# Patient Record
Sex: Male | Born: 1945
Health system: Southern US, Community
[De-identification: ages and names within clinical notes are randomized; demographics above are authoritative.]

## PROBLEM LIST (undated history)

## (undated) DIAGNOSIS — E785 Hyperlipidemia, unspecified: Secondary | ICD-10-CM

## (undated) DIAGNOSIS — K219 Gastro-esophageal reflux disease without esophagitis: Secondary | ICD-10-CM

## (undated) DIAGNOSIS — Z87442 Personal history of urinary calculi: Secondary | ICD-10-CM

## (undated) DIAGNOSIS — R93429 Abnormal radiologic findings on diagnostic imaging of unspecified kidney: Secondary | ICD-10-CM

## (undated) HISTORY — PX: OTHER SURGICAL HISTORY: SHX169

## (undated) HISTORY — PX: HEMORRHOID SURGERY: SHX153

---

## 1999-02-16 ENCOUNTER — Encounter: Payer: Self-pay | Admitting: Emergency Medicine

## 1999-02-16 ENCOUNTER — Emergency Department (HOSPITAL_COMMUNITY): Admission: EM | Admit: 1999-02-16 | Discharge: 1999-02-16 | Payer: Self-pay | Admitting: Emergency Medicine

## 2000-10-09 ENCOUNTER — Ambulatory Visit (HOSPITAL_COMMUNITY): Admission: RE | Admit: 2000-10-09 | Discharge: 2000-10-09 | Payer: Self-pay | Admitting: *Deleted

## 2003-04-12 ENCOUNTER — Encounter (HOSPITAL_BASED_OUTPATIENT_CLINIC_OR_DEPARTMENT_OTHER): Payer: Self-pay | Admitting: General Surgery

## 2003-04-14 ENCOUNTER — Ambulatory Visit (HOSPITAL_COMMUNITY): Admission: RE | Admit: 2003-04-14 | Discharge: 2003-04-14 | Payer: Self-pay | Admitting: General Surgery

## 2003-04-14 HISTORY — PX: HEMORRHOID SURGERY: SHX153

## 2003-10-31 ENCOUNTER — Ambulatory Visit (HOSPITAL_COMMUNITY): Admission: RE | Admit: 2003-10-31 | Discharge: 2003-10-31 | Payer: Self-pay | Admitting: *Deleted

## 2003-12-12 ENCOUNTER — Emergency Department (HOSPITAL_COMMUNITY): Admission: EM | Admit: 2003-12-12 | Discharge: 2003-12-12 | Payer: Self-pay | Admitting: Emergency Medicine

## 2003-12-26 ENCOUNTER — Observation Stay (HOSPITAL_COMMUNITY): Admission: RE | Admit: 2003-12-26 | Discharge: 2003-12-27 | Payer: Self-pay | Admitting: *Deleted

## 2003-12-26 HISTORY — PX: LAPAROSCOPIC CHOLECYSTECTOMY: SUR755

## 2004-02-07 ENCOUNTER — Emergency Department (HOSPITAL_COMMUNITY): Admission: EM | Admit: 2004-02-07 | Discharge: 2004-02-07 | Payer: Self-pay | Admitting: Emergency Medicine

## 2005-11-10 DIAGNOSIS — Z9889 Other specified postprocedural states: Secondary | ICD-10-CM | POA: Insufficient documentation

## 2005-12-12 ENCOUNTER — Ambulatory Visit (HOSPITAL_COMMUNITY): Admission: RE | Admit: 2005-12-12 | Discharge: 2005-12-12 | Payer: Self-pay | Admitting: *Deleted

## 2010-11-30 ENCOUNTER — Encounter: Payer: Self-pay | Admitting: Internal Medicine

## 2012-05-20 ENCOUNTER — Encounter (INDEPENDENT_AMBULATORY_CARE_PROVIDER_SITE_OTHER): Payer: BC Managed Care – PPO | Admitting: Ophthalmology

## 2012-05-20 DIAGNOSIS — D3130 Benign neoplasm of unspecified choroid: Secondary | ICD-10-CM

## 2012-05-20 DIAGNOSIS — H2510 Age-related nuclear cataract, unspecified eye: Secondary | ICD-10-CM

## 2012-05-20 DIAGNOSIS — H43819 Vitreous degeneration, unspecified eye: Secondary | ICD-10-CM

## 2012-10-05 ENCOUNTER — Emergency Department (HOSPITAL_COMMUNITY): Payer: Medicare Other

## 2012-10-05 ENCOUNTER — Emergency Department (HOSPITAL_COMMUNITY)
Admission: EM | Admit: 2012-10-05 | Discharge: 2012-10-05 | Disposition: A | Discharge status: Home / Self Care | Payer: Medicare Other | Attending: Emergency Medicine | Admitting: Emergency Medicine

## 2012-10-05 ENCOUNTER — Encounter (HOSPITAL_COMMUNITY): Payer: Self-pay | Admitting: Emergency Medicine

## 2012-10-05 DIAGNOSIS — N200 Calculus of kidney: Secondary | ICD-10-CM | POA: Insufficient documentation

## 2012-10-05 DIAGNOSIS — N201 Calculus of ureter: Secondary | ICD-10-CM | POA: Diagnosis not present

## 2012-10-05 DIAGNOSIS — K5730 Diverticulosis of large intestine without perforation or abscess without bleeding: Secondary | ICD-10-CM | POA: Diagnosis not present

## 2012-10-05 DIAGNOSIS — Z79899 Other long term (current) drug therapy: Secondary | ICD-10-CM | POA: Insufficient documentation

## 2012-10-05 DIAGNOSIS — Z7982 Long term (current) use of aspirin: Secondary | ICD-10-CM | POA: Insufficient documentation

## 2012-10-05 LAB — COMPREHENSIVE METABOLIC PANEL
ALT: 22 U/L (ref 0–53)
Albumin: 3.8 g/dL (ref 3.5–5.2)
Alkaline Phosphatase: 78 U/L (ref 39–117)
Calcium: 9.6 mg/dL (ref 8.4–10.5)
GFR calc Af Amer: 82 mL/min — ABNORMAL LOW (ref >90)
Potassium: 3.8 mEq/L (ref 3.5–5.1)
Sodium: 140 mEq/L (ref 135–145)
Total Protein: 6.7 g/dL (ref 6.0–8.3)

## 2012-10-05 LAB — CBC WITH DIFFERENTIAL/PLATELET
Basophils Absolute: 0 10*3/uL (ref 0.0–0.1)
Basophils Relative: 0 % (ref 0–1)
Eosinophils Absolute: 0.1 10*3/uL (ref 0.0–0.7)
Eosinophils Relative: 0 % (ref 0–5)
MCH: 30.8 pg (ref 26.0–34.0)
MCHC: 34.9 g/dL (ref 30.0–36.0)
MCV: 88.3 fL (ref 78.0–100.0)
Neutrophils Relative %: 78 % — ABNORMAL HIGH (ref 43–77)
Platelets: 255 10*3/uL (ref 150–400)
RBC: 4.87 MIL/uL (ref 4.22–5.81)
RDW: 12.2 % (ref 11.5–15.5)

## 2012-10-05 LAB — URINALYSIS, MICROSCOPIC ONLY
Bilirubin Urine: NEGATIVE
Nitrite: NEGATIVE
Specific Gravity, Urine: 1.023 (ref 1.005–1.030)
Urobilinogen, UA: 1 mg/dL (ref 0.0–1.0)
pH: 7.5 (ref 5.0–8.0)

## 2012-10-05 MED ORDER — FENTANYL CITRATE 0.05 MG/ML IJ SOLN
50.0000 ug | Freq: Once | INTRAMUSCULAR | Status: AC
  Administered 2012-10-05: 50 ug via INTRAVENOUS
  Filled 2012-10-05: qty 2

## 2012-10-05 MED ORDER — SODIUM CHLORIDE 0.9 % IV SOLN
Freq: Once | INTRAVENOUS | Status: AC
  Administered 2012-10-05: 20 mL/h via INTRAVENOUS

## 2012-10-05 MED ORDER — KETOROLAC TROMETHAMINE 30 MG/ML IJ SOLN
30.0000 mg | Freq: Once | INTRAMUSCULAR | Status: AC
  Administered 2012-10-05: 30 mg via INTRAVENOUS
  Filled 2012-10-05: qty 1

## 2012-10-05 MED ORDER — ONDANSETRON HCL 4 MG/2ML IJ SOLN
4.0000 mg | Freq: Once | INTRAMUSCULAR | Status: AC
  Administered 2012-10-05: 4 mg via INTRAVENOUS
  Filled 2012-10-05: qty 2

## 2012-10-05 MED ORDER — ONDANSETRON HCL 8 MG PO TABS: 8.0000 mg | ORAL_TABLET | Freq: Three times a day (TID) | ORAL | Status: DC | PRN

## 2012-10-05 MED ORDER — TAMSULOSIN HCL 0.4 MG PO CAPS: 0.4000 mg | ORAL_CAPSULE | Freq: Every day | ORAL | Status: DC

## 2012-10-05 MED ORDER — OXYCODONE-ACETAMINOPHEN 5-325 MG PO TABS: 2.0000 | ORAL_TABLET | ORAL | Status: DC | PRN

## 2012-10-05 MED ORDER — HYDROMORPHONE HCL PF 1 MG/ML IJ SOLN
1.0000 mg | Freq: Once | INTRAMUSCULAR | Status: AC
  Administered 2012-10-05: 1 mg via INTRAVENOUS
  Filled 2012-10-05: qty 1

## 2012-10-05 MED ORDER — MORPHINE SULFATE 4 MG/ML IJ SOLN
4.0000 mg | Freq: Once | INTRAMUSCULAR | Status: AC
  Administered 2012-10-05: 4 mg via INTRAVENOUS
  Filled 2012-10-05: qty 1

## 2012-10-05 NOTE — ED Notes (Signed)
Pt return from CT.

## 2012-10-05 NOTE — ED Provider Notes (Signed)
Steve Harris is a 66 y.o. male with onset of right flank pain today, it radiates to right abdomen. No similar, in the past. No dysuria, or urinary frequency. No visible hematuria.  Exam alert, cooperative. Abdomen soft, and nontender.   Evaluation consistent with proximal right ureter stone, as the source of pain. He stable for discharge   Medical screening examination/treatment/procedure(s) were conducted as a shared visit with non-physician practitioner(s) and myself.  I personally evaluated the patient during the encounter  Flint Melter, MD 10/08/12 [phone]

## 2012-10-05 NOTE — ED Notes (Signed)
Pt to CT

## 2012-10-05 NOTE — Discharge Instructions (Signed)
Take percocet as needed for severe pain.  Do not drive within four hours of taking this medication.  Take zofran as needed for nausea.  Take Flomax as prescribed.  This medication may cause lightheadedness upon standing.  Drink plenty of fluids.  Follow up with [facility].  You should return to the ER if you develop fever, worsening pain or uncontrolled vomiting.   Kidney Stones Kidney stones (ureteral lithiasis) are deposits that form inside your kidneys. The intense pain is caused by the stone moving through the urinary tract. When the stone moves, the ureter goes into spasm around the stone. The stone is usually passed in the urine.  CAUSES   A disorder that makes certain neck glands produce too much parathyroid hormone (primary hyperparathyroidism).  A buildup of uric acid crystals.  Narrowing (stricture) of the ureter.  A kidney obstruction present at birth (congenital obstruction).  Previous surgery on the kidney or ureters.  Numerous kidney infections. SYMPTOMS   Feeling sick to your stomach (nauseous).  Throwing up (vomiting).  Blood in the urine (hematuria).  Pain that usually spreads (radiates) to the groin.  Frequency or urgency of urination. DIAGNOSIS   Taking a history and physical exam.  Blood or urine tests.  Computerized X-ray scan (CT scan).  Occasionally, an examination of the inside of the urinary bladder (cystoscopy) is performed. TREATMENT   Observation.  Increasing your fluid intake.  Surgery may be needed if you have severe pain or persistent obstruction. The size, location, and chemical composition are all important variables that will determine the proper choice of action for you. Talk to your caregiver to better understand your situation so that you will minimize the risk of injury to yourself and your kidney.  HOME CARE INSTRUCTIONS   Drink enough water and fluids to keep your urine clear or pale yellow.  Strain all urine through the  provided strainer. Keep all particulate matter and stones for your caregiver to see. The stone causing the pain may be as small as a grain of salt. It is very important to use the strainer each and every time you pass your urine. The collection of your stone will allow your caregiver to analyze it and verify that a stone has actually passed.  Only take over-the-counter or prescription medicines for pain, discomfort, or fever as directed by your caregiver.  Make a follow-up appointment with your caregiver as directed.  Get follow-up X-rays if required. The absence of pain does not always mean that the stone has passed. It may have only stopped moving. If the urine remains completely obstructed, it can cause loss of kidney function or even complete destruction of the kidney. It is your responsibility to make sure X-rays and follow-ups are completed. Ultrasounds of the kidney can show blockages and the status of the kidney. Ultrasounds are not associated with any radiation and can be performed easily in a matter of minutes. SEEK IMMEDIATE MEDICAL CARE IF:   Pain cannot be controlled with the prescribed medicine.  You have a fever.  The severity or intensity of pain increases over 18 hours and is not relieved by pain medicine.  You develop a new onset of abdominal pain.  You feel faint or pass out. MAKE SURE YOU:   Understand these instructions.  Will watch your condition.  Will get help right away if you are not doing well or get worse. Document Released: 10/27/2005 Document Revised: 01/19/2012 Document Reviewed: 02/22/2010 [facility] Patient Information 2013 [place], [place].

## 2012-10-05 NOTE — ED Notes (Signed)
Per EMS, pt c/o abdominal pain since 1700 and nauseated.  Pt denies vomiting, 4 Zofran given en route.  Pt states more comfortable when lying on his side.

## 2012-10-05 NOTE — ED Notes (Signed)
Pt states that RLQ pain began at 1645.  Pt states "It felt like I had gas because I ate more fatty foods today than I should have.  I forced myself to have a BM, but it didn't help.  I had pain like this before with gall stones, but the removed my gallbladder about 10 years ago."

## 2012-10-05 NOTE — ED Provider Notes (Signed)
History     CSN: [id number]  Arrival date & time 10/05/12  1918   First MD Initiated Contact with Patient 10/05/12 1959      Chief Complaint  Patient presents with  . Abdominal Pain    (Consider location/radiation/quality/duration/timing/severity/associated sxs/prior treatment) HPI History provided by pt.   Pt reports acute onset severe RLQ pain at 4:45 today.  Non-radiating.  No aggravating/alleviating factors.  Associated w/ nausea.  Has had constipation, described as having to strain to have a BM, for the last 2 days.  No urinary sx nor testicular pain.  Past abd surgeries include cholecystectomy and inguinal hernia repair.  No h/o kidney stones.   History reviewed. No pertinent past medical history.  Past Surgical History  Procedure Date  . Cholecystectomy   . Hemorrhoid surgery   . Hernia repair     History reviewed. No pertinent family history.  History  Substance Use Topics  . Smoking status: Never Smoker   . Smokeless tobacco: Never Used  . Alcohol Use: No      Review of Systems  All other systems reviewed and are negative.    Allergies  Review of patient's allergies indicates no known allergies.  Home Medications   Current Outpatient Rx  Name  Route  Sig  Dispense  Refill  . ASPIRIN 81 MG PO TABS   Oral   Take 81 mg by mouth daily.         . ATORVASTATIN CALCIUM 20 MG PO TABS   Oral   Take 10 mg by mouth every evening.          Marland Kitchen ESOMEPRAZOLE MAGNESIUM 20 MG PO CPDR   Oral   Take 20 mg by mouth daily before breakfast.           BP 122/68  Pulse 54  Temp 98.8 F (37.1 C) (Oral)  Resp 20  Ht 5' 11.5" (1.816 m)  Wt 210 lb (95.255 kg)  BMI 28.88 kg/m2  SpO2 100%  Physical Exam  Nursing note and vitals reviewed. Constitutional: He is oriented to person, place, and time. He appears well-developed and well-nourished. No distress.       Pt writhing in pain and begging for pain medication  HENT:  Head: Normocephalic and atraumatic.    Eyes:       Normal appearance  Neck: Normal range of motion.  Cardiovascular: Normal rate and regular rhythm.   Pulmonary/Chest: Effort normal and breath sounds normal. No respiratory distress.  Abdominal: Soft. Bowel sounds are normal. He exhibits no distension and no mass. There is no tenderness. There is no rebound and no guarding.       obese  Genitourinary:       No CVA tenderness.  No testicular tenderness.   Musculoskeletal: Normal range of motion.  Neurological: He is alert and oriented to person, place, and time.  Skin: Skin is warm and dry. No rash noted.  Psychiatric: He has a normal mood and affect. His behavior is normal.    ED Course  Procedures (including critical care time)  Labs Reviewed  CBC WITH DIFFERENTIAL - Abnormal; Notable for the following:    WBC 12.1 (*)     Neutrophils Relative 78 (*)     Neutro Abs 9.4 (*)     All other components within normal limits  COMPREHENSIVE METABOLIC PANEL - Abnormal; Notable for the following:    Glucose, Bld 115 (*)     GFR calc non Af Amer 70 (*)  GFR calc Af Amer 82 (*)     All other components within normal limits  URINALYSIS, MICROSCOPIC ONLY - Abnormal; Notable for the following:    APPearance TURBID (*)     Hgb urine dipstick LARGE (*)     Ketones, ur 15 (*)     All other components within normal limits  LIPASE, BLOOD   Ct Abdomen Pelvis Wo Contrast  10/05/2012  *RADIOLOGY REPORT*  Clinical Data: Abdominal pain, right flank pain.  CT ABDOMEN AND PELVIS WITHOUT CONTRAST  Technique:  Multidetector CT imaging of the abdomen and pelvis was performed following the standard protocol without intravenous contrast.  Comparison: 12/12/2003  Findings: Lung bases are clear.  No effusions.  Heart is normal size.  Small hiatal hernia.  Prior cholecystectomy.  Liver, stomach, spleen, pancreas and adrenals are unremarkable.  There are bilateral renal parapelvic cysts.  Mild hydronephrosis on the right.  Extensive perinephric  stranding and fluid within the anterior pararenal space.  3 mm stone in the proximal right ureter. No hydronephrosis or renal/ureteral stones on the left.  Appendix is visualized and is normal.  Scattered sigmoid and descending colonic diverticula.  No active diverticulitis.  Small bowel is decompressed.  Mildly prominent prostate.  Urinary bladder is unremarkable.  No free fluid, free air or adenopathy.  Aorta is normal caliber.  IMPRESSION: 3 mm proximal right ureteral stone with mild right hydronephrosis. Perinephric stranding and fluid in the anterior pararenal space.  Prior cholecystectomy.  Small hiatal hernia.  Bilateral renal parapelvic cysts.  Descending colonic and sigmoid diverticulosis.   Original Report Authenticated By: Charlett Nose, M.D.      1. Kidney stone       MDM  [phone] M presents w/ acute onset severe RLQ pain and nausea at 4:45 today.  On exam afebrile, writhing in pain, abd benign, no testicular tenderness.  Suspect kidney stone.  CT abd/pelvis and labs pending.  Pt has received IV morphine and zofran.  Will reassess shortly.  8:22 PM   Pt receiving dilaudid for pain w/ some relief.  Nausea improved.  CT shows 3mm proximal R ureteral stone.  Results discussed w/ pt.  Will give him a dose of IV toradol and reassess shortly.  9:28 PM   Pain much improved and nausea resolved.  Tolerating pos.  D/c'd home w/ percocet, zofran, flomax and referral to urology.  Return precautions discussed.       Otilio Miu, PA-C 10/06/12 0107

## 2012-10-05 NOTE — ED Notes (Signed)
ZOX:[id number]<AV> Expected date:10/05/12<BR> Expected time: 7:12 PM<BR> Means of arrival:Ambulance<BR> Comments:<BR> abd pain

## 2012-10-11 DIAGNOSIS — K5900 Constipation, unspecified: Secondary | ICD-10-CM | POA: Diagnosis not present

## 2012-10-18 ENCOUNTER — Other Ambulatory Visit: Payer: Self-pay | Admitting: Urology

## 2012-10-19 ENCOUNTER — Encounter (HOSPITAL_BASED_OUTPATIENT_CLINIC_OR_DEPARTMENT_OTHER): Payer: Self-pay | Admitting: *Deleted

## 2012-10-19 NOTE — Progress Notes (Signed)
NPO AFTER MN. ARRIVES AT 0915. NEEDS HG AND KUB.  WILL TAKE NEXIUM AND IF NEEDED OXYCODONE/ ZOFRAN AM OF SURG W/ SIP OF WATER.

## 2012-10-21 ENCOUNTER — Ambulatory Visit (HOSPITAL_COMMUNITY): Payer: BC Managed Care – PPO

## 2012-10-21 ENCOUNTER — Ambulatory Visit (HOSPITAL_BASED_OUTPATIENT_CLINIC_OR_DEPARTMENT_OTHER)
Admission: RE | Admit: 2012-10-21 | Discharge: 2012-10-21 | Disposition: A | Discharge status: Home / Self Care | Payer: BC Managed Care – PPO | Source: Ambulatory Visit | Attending: Urology | Admitting: Urology

## 2012-10-21 ENCOUNTER — Encounter (HOSPITAL_BASED_OUTPATIENT_CLINIC_OR_DEPARTMENT_OTHER)
Admission: RE | Disposition: A | Discharge status: Home / Self Care | Payer: Self-pay | Source: Ambulatory Visit | Attending: Urology

## 2012-10-21 ENCOUNTER — Other Ambulatory Visit: Payer: Self-pay | Admitting: Urology

## 2012-10-21 ENCOUNTER — Encounter (HOSPITAL_BASED_OUTPATIENT_CLINIC_OR_DEPARTMENT_OTHER): Payer: Self-pay | Admitting: *Deleted

## 2012-10-21 ENCOUNTER — Encounter (HOSPITAL_BASED_OUTPATIENT_CLINIC_OR_DEPARTMENT_OTHER): Payer: Self-pay | Admitting: Anesthesiology

## 2012-10-21 ENCOUNTER — Ambulatory Visit (HOSPITAL_BASED_OUTPATIENT_CLINIC_OR_DEPARTMENT_OTHER): Payer: BC Managed Care – PPO | Admitting: Anesthesiology

## 2012-10-21 DIAGNOSIS — N210 Calculus in bladder: Secondary | ICD-10-CM | POA: Insufficient documentation

## 2012-10-21 DIAGNOSIS — Z79899 Other long term (current) drug therapy: Secondary | ICD-10-CM | POA: Insufficient documentation

## 2012-10-21 DIAGNOSIS — N201 Calculus of ureter: Secondary | ICD-10-CM | POA: Insufficient documentation

## 2012-10-21 DIAGNOSIS — N289 Disorder of kidney and ureter, unspecified: Secondary | ICD-10-CM | POA: Insufficient documentation

## 2012-10-21 DIAGNOSIS — Z7982 Long term (current) use of aspirin: Secondary | ICD-10-CM | POA: Insufficient documentation

## 2012-10-21 DIAGNOSIS — R9389 Abnormal findings on diagnostic imaging of other specified body structures: Secondary | ICD-10-CM | POA: Insufficient documentation

## 2012-10-21 DIAGNOSIS — K219 Gastro-esophageal reflux disease without esophagitis: Secondary | ICD-10-CM | POA: Insufficient documentation

## 2012-10-21 HISTORY — DX: Gastro-esophageal reflux disease without esophagitis: K21.9

## 2012-10-21 HISTORY — PX: CYSTOSCOPY WITH RETROGRADE PYELOGRAM, URETEROSCOPY AND STENT PLACEMENT: SHX5789

## 2012-10-21 HISTORY — DX: Hyperlipidemia, unspecified: E78.5

## 2012-10-21 LAB — POCT HEMOGLOBIN-HEMACUE: Hemoglobin: 14.7 g/dL (ref 13.0–17.0)

## 2012-10-21 SURGERY — CYSTOURETEROSCOPY, WITH RETROGRADE PYELOGRAM AND STENT INSERTION
Anesthesia: General | Site: Ureter | Laterality: Right | Wound class: Clean Contaminated

## 2012-10-21 MED ORDER — IOHEXOL 350 MG/ML SOLN
INTRAVENOUS | Status: DC | PRN
  Administered 2012-10-21: 8 mL via INTRAVENOUS

## 2012-10-21 MED ORDER — OXYCODONE-ACETAMINOPHEN 5-325 MG PO TABS
1.0000 | ORAL_TABLET | ORAL | Status: DC | PRN
  Administered 2012-10-21: 1 via ORAL
  Filled 2012-10-21: qty 2

## 2012-10-21 MED ORDER — PROMETHAZINE HCL 25 MG/ML IJ SOLN
6.2500 mg | INTRAMUSCULAR | Status: DC | PRN
  Filled 2012-10-21: qty 1

## 2012-10-21 MED ORDER — SODIUM CHLORIDE 0.9 % IR SOLN
Status: DC | PRN
  Administered 2012-10-21: 1 via INTRAVESICAL

## 2012-10-21 MED ORDER — ACETAMINOPHEN 325 MG PO TABS
650.0000 mg | ORAL_TABLET | ORAL | Status: DC | PRN
  Filled 2012-10-21: qty 2

## 2012-10-21 MED ORDER — ONDANSETRON HCL 4 MG/2ML IJ SOLN
4.0000 mg | Freq: Four times a day (QID) | INTRAMUSCULAR | Status: DC | PRN
  Filled 2012-10-21: qty 2

## 2012-10-21 MED ORDER — PHENAZOPYRIDINE HCL 200 MG PO TABS: 200.0000 mg | ORAL_TABLET | Freq: Three times a day (TID) | ORAL | Status: DC | PRN

## 2012-10-21 MED ORDER — DEXAMETHASONE SODIUM PHOSPHATE 4 MG/ML IJ SOLN
INTRAMUSCULAR | Status: DC | PRN
  Administered 2012-10-21: 4 mg via INTRAVENOUS

## 2012-10-21 MED ORDER — MEPERIDINE HCL 25 MG/ML IJ SOLN
6.2500 mg | INTRAMUSCULAR | Status: DC | PRN
  Filled 2012-10-21: qty 1

## 2012-10-21 MED ORDER — SODIUM CHLORIDE 0.9 % IJ SOLN
3.0000 mL | INTRAMUSCULAR | Status: DC | PRN
  Filled 2012-10-21: qty 3

## 2012-10-21 MED ORDER — PHENAZOPYRIDINE HCL 200 MG PO TABS
200.0000 mg | ORAL_TABLET | Freq: Three times a day (TID) | ORAL | Status: DC
  Administered 2012-10-21: 200 mg via ORAL
  Filled 2012-10-21: qty 1

## 2012-10-21 MED ORDER — PROPOFOL 10 MG/ML IV BOLUS
INTRAVENOUS | Status: DC | PRN
  Administered 2012-10-21: 150 mg via INTRAVENOUS
  Administered 2012-10-21: 20 mg via INTRAVENOUS

## 2012-10-21 MED ORDER — SODIUM CHLORIDE 0.9 % IJ SOLN
3.0000 mL | Freq: Two times a day (BID) | INTRAMUSCULAR | Status: DC
  Filled 2012-10-21: qty 3

## 2012-10-21 MED ORDER — FENTANYL CITRATE 0.05 MG/ML IJ SOLN
25.0000 ug | INTRAMUSCULAR | Status: DC | PRN
  Filled 2012-10-21: qty 1

## 2012-10-21 MED ORDER — OXYCODONE HCL 5 MG PO TABS
5.0000 mg | ORAL_TABLET | ORAL | Status: DC | PRN
  Filled 2012-10-21: qty 2

## 2012-10-21 MED ORDER — LACTATED RINGERS IV SOLN
INTRAVENOUS | Status: DC
  Administered 2012-10-21: 100 mL/h via INTRAVENOUS
  Filled 2012-10-21: qty 1000

## 2012-10-21 MED ORDER — FENTANYL CITRATE 0.05 MG/ML IJ SOLN
25.0000 ug | INTRAMUSCULAR | Status: DC | PRN
  Administered 2012-10-21: 25 ug via INTRAVENOUS
  Filled 2012-10-21: qty 1

## 2012-10-21 MED ORDER — SODIUM CHLORIDE 0.9 % IV SOLN
250.0000 mL | INTRAVENOUS | Status: DC | PRN
  Filled 2012-10-21: qty 250

## 2012-10-21 MED ORDER — ACETAMINOPHEN 650 MG RE SUPP
650.0000 mg | RECTAL | Status: DC | PRN
  Filled 2012-10-21: qty 1

## 2012-10-21 MED ORDER — LIDOCAINE HCL (CARDIAC) 20 MG/ML IV SOLN
INTRAVENOUS | Status: DC | PRN
  Administered 2012-10-21: 60 mg via INTRAVENOUS

## 2012-10-21 MED ORDER — FENTANYL CITRATE 0.05 MG/ML IJ SOLN
INTRAMUSCULAR | Status: DC | PRN
  Administered 2012-10-21 (×2): 25 ug via INTRAVENOUS
  Administered 2012-10-21: 50 ug via INTRAVENOUS

## 2012-10-21 MED ORDER — ONDANSETRON HCL 4 MG/2ML IJ SOLN
INTRAMUSCULAR | Status: DC | PRN
  Administered 2012-10-21: 4 mg via INTRAVENOUS

## 2012-10-21 MED ORDER — CIPROFLOXACIN IN D5W 400 MG/200ML IV SOLN
400.0000 mg | INTRAVENOUS | Status: AC
  Administered 2012-10-21: 400 mg via INTRAVENOUS
  Filled 2012-10-21: qty 200

## 2012-10-21 MED ORDER — LACTATED RINGERS IV SOLN
INTRAVENOUS | Status: DC
  Filled 2012-10-21: qty 1000

## 2012-10-21 SURGICAL SUPPLY — 37 items
BAG DRAIN URO-CYSTO SKYTR STRL (DRAIN) ×3 IMPLANT
BAG DRN UROCATH (DRAIN) ×2
BASKET LASER NITINOL 1.9FR (BASKET) IMPLANT
BASKET ZERO TIP NITINOL 2.4FR (BASKET) IMPLANT
BRUSH URET BIOPSY 3F (UROLOGICAL SUPPLIES) IMPLANT
BSKT STON RTRVL 120 1.9FR (BASKET)
BSKT STON RTRVL ZERO TP 2.4FR (BASKET)
CANISTER SUCT LVC 12 LTR MEDI- (MISCELLANEOUS) ×3 IMPLANT
CATH COUDE FOLEY 2W 5CC 18FR (CATHETERS) ×3 IMPLANT
CATH URET 5FR 28IN CONE TIP (BALLOONS) ×1
CATH URET 5FR 28IN OPEN ENDED (CATHETERS) ×3 IMPLANT
CATH URET 5FR 70CM CONE TIP (BALLOONS) ×2 IMPLANT
CLOTH BEACON ORANGE TIMEOUT ST (SAFETY) ×3 IMPLANT
DRAPE CAMERA CLOSED 9X96 (DRAPES) ×3 IMPLANT
ELECT REM PT RETURN 9FT ADLT (ELECTROSURGICAL) ×3
ELECTRODE REM PT RTRN 9FT ADLT (ELECTROSURGICAL) ×2 IMPLANT
GLOVE BIO SURGEON STRL SZ7.5 (GLOVE) ×3 IMPLANT
GLOVE SURG SS PI 8.0 STRL IVOR (GLOVE) ×3 IMPLANT
GOWN PREVENTION PLUS LG XLONG (DISPOSABLE) ×3 IMPLANT
GOWN STRL REIN XL XLG (GOWN DISPOSABLE) ×3 IMPLANT
GUIDEWIRE 0.038 PTFE COATED (WIRE) ×3 IMPLANT
GUIDEWIRE ANG ZIPWIRE 038X150 (WIRE) IMPLANT
GUIDEWIRE STR DUAL SENSOR (WIRE) ×3 IMPLANT
IV NS IRRIG 3000ML ARTHROMATIC (IV SOLUTION) ×3 IMPLANT
KIT BALLIN UROMAX 15FX10 (LABEL) IMPLANT
KIT BALLN UROMAX 15FX4 (MISCELLANEOUS) IMPLANT
KIT BALLN UROMAX 26 75X4 (MISCELLANEOUS)
LASER FIBER DISP (UROLOGICAL SUPPLIES) IMPLANT
LASER FIBER DISP 1000U (UROLOGICAL SUPPLIES) IMPLANT
PACK CYSTOSCOPY (CUSTOM PROCEDURE TRAY) ×3 IMPLANT
SET HIGH PRES BAL DIL (LABEL)
SHEATH ACCESS URETERAL 38CM (SHEATH) IMPLANT
SHEATH ACCESS URETERAL 54CM (SHEATH) IMPLANT
SHEATH URET ACCESS 12FR/55CM (UROLOGICAL SUPPLIES) ×3 IMPLANT
STENT CONTOUR 6FRX26X.038 (STENTS) IMPLANT
STENT URET 6FRX26 CONTOUR (STENTS) ×3 IMPLANT
SYRINGE 10CC LL (SYRINGE) ×3 IMPLANT

## 2012-10-21 NOTE — Anesthesia Preprocedure Evaluation (Signed)
Anesthesia Evaluation  Patient identified by MRN, date of birth, ID band Patient awake    Reviewed: Allergy & Precautions, H&P , NPO status , Patient's Chart, lab work & pertinent test results  Airway Mallampati: II TM Distance: >3 FB Neck ROM: Full    Dental No notable dental hx.    Pulmonary neg pulmonary ROS,  breath sounds clear to auscultation  Pulmonary exam normal       Cardiovascular negative cardio ROS  Rhythm:Regular Rate:Normal     Neuro/Psych negative neurological ROS  negative psych ROS   GI/Hepatic negative GI ROS, Neg liver ROS, GERD-  Medicated and Controlled,  Endo/Other  negative endocrine ROS  Renal/GU negative Renal ROS  negative genitourinary   Musculoskeletal negative musculoskeletal ROS (+)   Abdominal   Peds negative pediatric ROS (+)  Hematology negative hematology ROS (+)   Anesthesia Other Findings   Reproductive/Obstetrics negative OB ROS                           Anesthesia Physical Anesthesia Plan  ASA: II  Anesthesia Plan: General   Post-op Pain Management:    Induction: Intravenous  Airway Management Planned: LMA  Additional Equipment:   Intra-op Plan:   Post-operative Plan: Extubation in OR  Informed Consent: I have reviewed the patients History and Physical, chart, labs and discussed the procedure including the risks, benefits and alternatives for the proposed anesthesia with the patient or authorized representative who has indicated his/her understanding and acceptance.   Dental advisory given  Plan Discussed with: CRNA  Anesthesia Plan Comments:         Anesthesia Quick Evaluation

## 2012-10-21 NOTE — H&P (Signed)
ctive Problems Problems  1. Renal Insufficiency 593.9 2. Ureteral Stone 592.1  History of Present Illness        Steve Harris returns today in f/u.  He has a right ureteral stone and was referred back last week after he was found to have a Cr of 2.3.  He saw Denna Haggard in our office and he had right hydro on Friday on an [place] and had no obvious stone on KUB.  On CT his stone had been proximal and on our initial KUB I thought the stone might have moved distally but now I am not sure.   A repeat Cr today is 1.7.   He has minimal tenderness in the right lower quadrant and no nausea or gross hematuria.   Past Medical History Problems  1. History of  Esophageal Reflux 530.81  Surgical History Problems  1. History of  Cholecystectomy 2. History of  Hemorrhoidectomy 3. History of  Hernia Repair Right  Current Meds 1. Aspirin 81 MG Oral Tablet; Therapy: (Recorded:02Dec2013) to 2. HYDROmorphone HCl 2 MG Oral Tablet; TAKE 1 TABLET EVERY 4 TO 6 HOURS AS NEEDED;  Therapy: 02Dec2013 to (Evaluate:09Dec2013); Last Rx:02Dec2013 3. Lipitor 20 MG Oral Tablet; Therapy: (Recorded:02Dec2013) to 4. NexIUM 20 MG Oral Capsule Delayed Release; Therapy: (Recorded:02Dec2013) to 5. Ondansetron 4 MG Oral Tablet Dispersible; TAKE 1 TABLET Every 6 hours PRN nausea can take  1-2 tabs; Therapy: 02Dec2013 to (Last Rx:02Dec2013)  Requested for: 02Dec2013 6. Ondansetron HCl 8 MG Oral Tablet; Therapy: 26Nov2013 to 7. Oxycodone-Acetaminophen 5-325 MG Oral Tablet; Therapy: 02Dec2013 to 8. Tamsulosin HCl 0.4 MG Oral Capsule; Therapy: 02Dec2013 to  Allergies Medication  1. No Known Drug Allergies  Family History Problems  1. Family history of  Death In The Family Father cancer and diabetes 2. Paternal history of  Diabetes Mellitus V18.0  Social History Problems  1. Caffeine Use 2. Marital History - Currently Married 3. Occupation: Airline pilot 4. History of  Tobacco Use 305.1 smoked 1/2 ppd x 4 years Denied  5.  History of  Alcohol Use  Review of Systems  Constitutional: no fever.  Cardiovascular: no chest pain and no leg swelling.  Respiratory: no shortness of breath.    Vitals Vital Signs [Data Includes: Last 1 Day]  09Dec2013 12:11PM  Blood Pressure: 125 / 78 Heart Rate: 76  Physical Exam Constitutional: Well nourished and well developed . No acute distress.  Pulmonary: No respiratory distress and normal respiratory rhythm and effort.  Cardiovascular: Heart rate and rhythm are normal . No peripheral edema.  Abdomen: The abdomen is soft and nontender. No masses are palpated. No CVA tenderness. No hernias are palpable. No hepatosplenomegaly noted.    Results/Data Urine [Data Includes: Last 1 Day]   09Dec2013  COLOR YELLOW   APPEARANCE CLEAR   SPECIFIC GRAVITY 1.020   pH 5.5   GLUCOSE NEG mg/dL  BILIRUBIN NEG   KETONE NEG mg/dL  BLOOD MOD   PROTEIN NEG mg/dL  UROBILINOGEN 0.2 mg/dL  NITRITE NEG   LEUKOCYTE ESTERASE NEG   SQUAMOUS EPITHELIAL/HPF NONE SEEN   WBC 7-10 WBC/hpf  RBC 3-6 RBC/hpf  BACTERIA NONE SEEN   CRYSTALS NONE SEEN   CASTS NONE SEEN    The following images/tracing/specimen were independently visualized:  I have reviewed his recent US and KUB films.  The following clinical lab reports were reviewed:  I have reviewed his lab work from Dr. Renne Crigler including the Cr levels.    Assessment Assessed  1. Ureteral Stone 592.1  2. Renal Insufficiency 593.9   He stone doesn't appear to have passed.  He has minimal symptoms but persistent renal insufficiency and he had significant hydro on his recent US.   Plan Health Maintenance (V70.0)  1. UA With REFLEX  Done: 09Dec2013 11:52AM Ureteral Stone (592.1)  2. Follow-up Office  Follow-up  Requested for: 09Dec2013 3. Follow-up Schedule Surgery Office  Follow-up  Done: 09Dec2013   I think we need to go ahead and intervene and will set him up for cystoscopy with right RTG pyelogram and possible ureteroscopy and stenting  for later this week.   I reviewed the risks of bleeding, infection, ureteral injury, need for a stent and secondary procedures, thrombotic events and anesthetic complications.

## 2012-10-21 NOTE — Interval H&P Note (Signed)
History and Physical Interval Note:  10/21/2012 10:45 AM  Steve Harris  has presented today for surgery, with the diagnosis of RIGHT URETERAL STONE  The various methods of treatment have been discussed with the patient and family. After consideration of risks, benefits and other options for treatment, the patient has consented to  Procedure(s) (LRB) with comments: CYSTOSCOPY WITH RETROGRADE PYELOGRAM, URETEROSCOPY AND STENT PLACEMENT (Right) - POSSIBLE URETEROSCOPY AND STENT HOLMIUM LASER APPLICATION (N/A) as a surgical intervention .  The patient's history has been reviewed, patient examined, no change in status, stable for surgery.  I have reviewed the patient's chart and labs.  Questions were answered to the patient's satisfaction.     [name],Gill J  I have reviewed his KUB and don't see an obvious stone but he still has symptoms so we will proceed.

## 2012-10-21 NOTE — Transfer of Care (Signed)
Immediate Anesthesia Transfer of Care Note  Patient: Steve Harris  Procedure(s) Performed: Procedure(s) (LRB): CYSTOSCOPY WITH RETROGRADE PYELOGRAM, URETEROSCOPY AND STENT PLACEMENT (Right)  Patient Location: PACU  Anesthesia Type: General  Level of Consciousness: awake, alert  and oriented  Airway & Oxygen Therapy: Patient Spontanous Breathing and Patient connected to face mask oxygen  Post-op Assessment: Report given to PACU RN and Post -op Vital signs reviewed and stable  Post vital signs: Reviewed and stable  Complications: No apparent anesthesia complications

## 2012-10-21 NOTE — Brief Op Note (Signed)
10/21/2012  11:55 AM  PATIENT:  Steve Harris  66 y.o. male  PRE-OPERATIVE DIAGNOSIS:  RIGHT URETERAL STONE  POST-OPERATIVE DIAGNOSIS:  RIGHT URETERAL STONE  PROCEDURE:  Procedure(s) (LRB) with comments: CYSTOSCOPY WITH BLADDER STONE REMOVAL, RETROGRADE PYELOGRAM, AND STENT PLACEMENT (Right) - no ureteroscopy  SURGEON:  Surgeon(s) and Role:    * Anner Crete, MD - Primary  PHYSICIAN ASSISTANT:   ASSISTANTS: none   ANESTHESIA:   general  EBL:  Total I/O In: 200 [I.V.:200] Out: -   BLOOD ADMINISTERED:none  DRAINS: Urinary Catheter (Foley) and 6x26 right JJ stent.    LOCAL MEDICATIONS USED:  NONE  SPECIMEN:  Source of Specimen:  stone from bladder  DISPOSITION OF SPECIMEN:  to family  COUNTS:  YES  TOURNIQUET:  * No tourniquets in log *  DICTATION: .Other Dictation: Dictation Number [id number]  PLAN OF CARE: Discharge to home after PACU  PATIENT DISPOSITION:  PACU - hemodynamically stable.   Delay start of Pharmacological VTE agent (>24hrs) due to surgical blood loss or risk of bleeding: not applicable

## 2012-10-22 ENCOUNTER — Encounter (HOSPITAL_BASED_OUTPATIENT_CLINIC_OR_DEPARTMENT_OTHER): Payer: Self-pay | Admitting: Urology

## 2012-10-22 NOTE — Op Note (Signed)
NAME:  Steve Harris, Steve Harris NO.:  [id number]  MEDICAL RECORD NO.:  [id number]  LOCATION:                                 FACILITY:  PHYSICIAN:  Jacy Howat. Annabell Howells, M.D.    DATE OF BIRTH:  June 28, 1946  DATE OF PROCEDURE:  10/21/2012 DATE OF DISCHARGE:                              OPERATIVE REPORT   PROCEDURES:  Cystoscopy, removal of bladder stones, right retrograde pyelogram with interpretation, insertion of right double-J stent.  PREOPERATIVE DIAGNOSIS:  Right ureteral stone.  POSTOPERATIVE DIAGNOSIS:  Right ureteral stone with a stone passed into the bladder and a filling defect in the right renal pelvis.  SURGEON:  Authur Cubit. Annabell Howells, MD  ANESTHESIA:  General.  SPECIMEN:  Stone.  BLOOD LOSS:  Minimal.  DRAINS:  A 6-French 26 cm double-J stent and 18-French coude Foley catheter.  COMPLICATIONS:  None.  INDICATIONS:  Mr. [name] is a 66 year old white male, who was referred with a 3 mm right proximal stone.  He has had persistent symptoms but the stone was not readily visible on a KUB.  I saw the distal ureter but not on subsequent films but he has continued to have symptoms and abnormal urine.  So it was felt that cysto and possible ureteroscopy was indicated.  FINDINGS OF PROCEDURE:  He was given Cipro.  He was taken to the operating room where general anesthetic was induced.  He was placed in lithotomy position and fitted with PAS hose.  Perineum and genitalia were prepped with Betadine solution and draped in usual sterile fashion.  Cystoscopy was performed using a 22-French scope and 12-degree lens. Examination revealed normal urethra.  The external sphincter was intact. The prostatic urethra was approximately 4 cm in length, bilobar hyperplasia with some obstruction.  Examination of the bladder revealed mild trabeculation.  The left ureteral orifice was unremarkable.  The right ureteral orifice had some hemorrhagic changes and in the middle of the bladder  was a 3 mm calculus consistent with ureteral stone.  This was evacuated from the bladder.  No other bladder wall abnormalities were noted.  Because of the patient's persistent symptoms, I elected to do a retrograde pyelogram.  This revealed a normal ureter from the meatus just below the UPJ.  There was a kink at the UPJ level and in the kidney there was a bifid system with an ovoid filling defect, approximately 10 x 15 mm in size, which is suspicious for clot versus neoplasm.  I also noted some extravasation from the proximal ureter.  At this point, I was able to pass a guidewire through the opening catheter to the kidney without difficulty and was going to attempt ureteroscopy.  The cystoscope was removed leaving the wire in place and the introducer sheath, the inner core of a 55 cm dilator access sheath was passed over the wire, but was only able to get to the mid proximal ureter.  I did not care to force it.  At this point, the dilator was removed.  The cystoscope was reinserted over the wire and a 6-French 26 cm double-J stent without string was passed without difficulty to the kidney under fluoroscopic guidance.  The  wire was removed leaving good coil in the kidney and a good coil in the bladder.  At this point, there was some bloody efflux from the ureteral orifice. The cystoscope was backed out and there was a mucosal tear in the bulbar urethra to the right of midline about 3 cm distal to the external sphincter.  Because of the extravasation from area of the UPJ and the small mucosal tear in the urethra, it was felt that a Foley catheter would be valuable for drainage overnight.  So an 18-French Coude Foley was placed without difficulty.  The balloon was filled with 10 mL sterile fluid and the catheter was placed to leg bag drainage.  The patient was taken down from lithotomy position.  His anesthetic was reversed.  He was moved to recovery room in stable condition.  The  only complication was a small mucosal tear.     Excell Seltzer. Annabell Howells, M.D.     JJW/MEDQ  D:  10/21/2012  T:  10/22/2012  Job:  [id number]

## 2012-10-22 NOTE — Anesthesia Postprocedure Evaluation (Signed)
  Anesthesia Post-op Note  Patient: Steve Harris  Procedure(s) Performed: Procedure(s) (LRB): CYSTOSCOPY WITH RETROGRADE PYELOGRAM, URETEROSCOPY AND STENT PLACEMENT (Right)  Patient Location: PACU  Anesthesia Type: General  Level of Consciousness: awake and alert   Airway and Oxygen Therapy: Patient Spontanous Breathing  Post-op Pain: mild  Post-op Assessment: Post-op Vital signs reviewed, Patient's Cardiovascular Status Stable, Respiratory Function Stable, Patent Airway and No signs of Nausea or vomiting  Last Vitals:  Filed Vitals:   10/21/12 1319  BP: 148/84  Pulse: 57  Temp: 36.4 C  Resp: 16    Post-op Vital Signs: stable   Complications: No apparent anesthesia complications

## 2012-10-22 NOTE — Progress Notes (Signed)
SPOKE W/ WIFE. NPO AFTER MN. ARRIVES AT 0815. HG DONE 10-21-2012, RESULT W/ CHART. OLD EKG W/ CHART. ALSO DID POST OF CALL. STATES CATHETER REMOVAL WENT OKAY AND HAS VOIDED WITHOUT ANY ISSUES.

## 2012-10-26 ENCOUNTER — Encounter (HOSPITAL_BASED_OUTPATIENT_CLINIC_OR_DEPARTMENT_OTHER)
Admission: RE | Disposition: A | Discharge status: Home / Self Care | Payer: Self-pay | Source: Ambulatory Visit | Attending: Urology

## 2012-10-26 ENCOUNTER — Encounter (HOSPITAL_BASED_OUTPATIENT_CLINIC_OR_DEPARTMENT_OTHER): Payer: Self-pay | Admitting: Anesthesiology

## 2012-10-26 ENCOUNTER — Encounter (HOSPITAL_BASED_OUTPATIENT_CLINIC_OR_DEPARTMENT_OTHER): Payer: Self-pay | Admitting: *Deleted

## 2012-10-26 ENCOUNTER — Ambulatory Visit (HOSPITAL_BASED_OUTPATIENT_CLINIC_OR_DEPARTMENT_OTHER): Payer: Medicare Other | Admitting: Anesthesiology

## 2012-10-26 ENCOUNTER — Ambulatory Visit (HOSPITAL_BASED_OUTPATIENT_CLINIC_OR_DEPARTMENT_OTHER)
Admission: RE | Admit: 2012-10-26 | Discharge: 2012-10-26 | Disposition: A | Discharge status: Home / Self Care | Payer: Medicare Other | Source: Ambulatory Visit | Attending: Urology | Admitting: Urology

## 2012-10-26 DIAGNOSIS — Z7982 Long term (current) use of aspirin: Secondary | ICD-10-CM | POA: Insufficient documentation

## 2012-10-26 DIAGNOSIS — Z79899 Other long term (current) drug therapy: Secondary | ICD-10-CM | POA: Diagnosis not present

## 2012-10-26 DIAGNOSIS — K219 Gastro-esophageal reflux disease without esophagitis: Secondary | ICD-10-CM | POA: Insufficient documentation

## 2012-10-26 DIAGNOSIS — R944 Abnormal results of kidney function studies: Secondary | ICD-10-CM | POA: Insufficient documentation

## 2012-10-26 DIAGNOSIS — N135 Crossing vessel and stricture of ureter without hydronephrosis: Secondary | ICD-10-CM | POA: Diagnosis not present

## 2012-10-26 DIAGNOSIS — N289 Disorder of kidney and ureter, unspecified: Secondary | ICD-10-CM | POA: Insufficient documentation

## 2012-10-26 HISTORY — DX: Personal history of urinary calculi: Z87.442

## 2012-10-26 HISTORY — DX: Abnormal radiologic findings on diagnostic imaging of unspecified kidney: R93.429

## 2012-10-26 HISTORY — PX: CYSTOSCOPY WITH RETROGRADE PYELOGRAM, URETEROSCOPY AND STENT PLACEMENT: SHX5789

## 2012-10-26 SURGERY — CYSTOURETEROSCOPY, WITH RETROGRADE PYELOGRAM AND STENT INSERTION
Anesthesia: General | Laterality: Right | Wound class: Clean Contaminated

## 2012-10-26 MED ORDER — LIDOCAINE HCL (CARDIAC) 20 MG/ML IV SOLN
INTRAVENOUS | Status: DC | PRN
  Administered 2012-10-26: 75 mg via INTRAVENOUS

## 2012-10-26 MED ORDER — IOHEXOL 350 MG/ML SOLN
INTRAVENOUS | Status: DC | PRN
  Administered 2012-10-26: 10 mL via INTRAVENOUS

## 2012-10-26 MED ORDER — PHENAZOPYRIDINE HCL 200 MG PO TABS
200.0000 mg | ORAL_TABLET | Freq: Three times a day (TID) | ORAL | Status: DC
  Administered 2012-10-26: 200 mg via ORAL
  Filled 2012-10-26: qty 1

## 2012-10-26 MED ORDER — DEXAMETHASONE SODIUM PHOSPHATE 4 MG/ML IJ SOLN
INTRAMUSCULAR | Status: DC | PRN
  Administered 2012-10-26: 10 mg via INTRAVENOUS

## 2012-10-26 MED ORDER — EPHEDRINE SULFATE 50 MG/ML IJ SOLN
INTRAMUSCULAR | Status: DC | PRN
  Administered 2012-10-26: 10 mg via INTRAVENOUS

## 2012-10-26 MED ORDER — BELLADONNA ALKALOIDS-OPIUM 16.2-60 MG RE SUPP
RECTAL | Status: DC | PRN
  Administered 2012-10-26: 1 via RECTAL

## 2012-10-26 MED ORDER — FENTANYL CITRATE 0.05 MG/ML IJ SOLN
25.0000 ug | INTRAMUSCULAR | Status: DC | PRN
  Administered 2012-10-26 (×2): 25 ug via INTRAVENOUS
  Filled 2012-10-26: qty 1

## 2012-10-26 MED ORDER — FENTANYL CITRATE 0.05 MG/ML IJ SOLN
INTRAMUSCULAR | Status: DC | PRN
  Administered 2012-10-26: 50 ug via INTRAVENOUS
  Administered 2012-10-26 (×4): 25 ug via INTRAVENOUS

## 2012-10-26 MED ORDER — GLYCOPYRROLATE 0.2 MG/ML IJ SOLN
INTRAMUSCULAR | Status: DC | PRN
  Administered 2012-10-26: 0.2 mg via INTRAVENOUS

## 2012-10-26 MED ORDER — PROMETHAZINE HCL 25 MG/ML IJ SOLN
6.2500 mg | INTRAMUSCULAR | Status: DC | PRN
  Filled 2012-10-26: qty 1

## 2012-10-26 MED ORDER — CIPROFLOXACIN IN D5W 400 MG/200ML IV SOLN
400.0000 mg | INTRAVENOUS | Status: AC
  Administered 2012-10-26: 400 mg via INTRAVENOUS
  Filled 2012-10-26: qty 200

## 2012-10-26 MED ORDER — PROPOFOL 10 MG/ML IV BOLUS
INTRAVENOUS | Status: DC | PRN
  Administered 2012-10-26: 240 mg via INTRAVENOUS

## 2012-10-26 MED ORDER — LACTATED RINGERS IV SOLN
INTRAVENOUS | Status: DC
  Administered 2012-10-26 (×2): via INTRAVENOUS
  Administered 2012-10-26: 100 mL/h via INTRAVENOUS
  Filled 2012-10-26: qty 1000

## 2012-10-26 MED ORDER — OXYCODONE-ACETAMINOPHEN 5-325 MG PO TABS
1.0000 | ORAL_TABLET | ORAL | Status: DC | PRN
Start: 2012-10-26 — End: 2012-10-26
  Administered 2012-10-26: 1 via ORAL
  Filled 2012-10-26: qty 1

## 2012-10-26 MED ORDER — SODIUM CHLORIDE 0.9 % IR SOLN
Status: DC | PRN
  Administered 2012-10-26: 6000 mL via INTRAVESICAL

## 2012-10-26 SURGICAL SUPPLY — 38 items
BAG DRAIN URO-CYSTO SKYTR STRL (DRAIN) ×2 IMPLANT
BAG DRN UROCATH (DRAIN) ×1
BASKET LASER NITINOL 1.9FR (BASKET) IMPLANT
BASKET STNLS GEMINI 4WIRE 3FR (BASKET) IMPLANT
BASKET ZERO TIP NITINOL 2.4FR (BASKET) IMPLANT
BRUSH URET BIOPSY 3F (UROLOGICAL SUPPLIES) IMPLANT
BSKT STON RTRVL 120 1.9FR (BASKET)
BSKT STON RTRVL GEM 120X11 3FR (BASKET)
BSKT STON RTRVL ZERO TP 2.4FR (BASKET)
CANISTER SUCT LVC 12 LTR MEDI- (MISCELLANEOUS) ×2 IMPLANT
CATH URET 5FR 28IN CONE TIP (BALLOONS)
CATH URET 5FR 28IN OPEN ENDED (CATHETERS) IMPLANT
CATH URET 5FR 70CM CONE TIP (BALLOONS) IMPLANT
CLOTH BEACON ORANGE TIMEOUT ST (SAFETY) ×2 IMPLANT
DRAPE CAMERA CLOSED 9X96 (DRAPES) ×2 IMPLANT
ELECT REM PT RETURN 9FT ADLT (ELECTROSURGICAL)
ELECTRODE REM PT RTRN 9FT ADLT (ELECTROSURGICAL) IMPLANT
GLOVE BIO SURGEON STRL SZ7.5 (GLOVE) ×2 IMPLANT
GLOVE INDICATOR 6.5 STRL GRN (GLOVE) ×2 IMPLANT
GLOVE INDICATOR 7.5 STRL GRN (GLOVE) ×2 IMPLANT
GLOVE SURG SS PI 8.0 STRL IVOR (GLOVE) ×2 IMPLANT
GOWN PREVENTION PLUS LG XLONG (DISPOSABLE) ×2 IMPLANT
GOWN STRL NON-REIN LRG LVL3 (GOWN DISPOSABLE) ×2 IMPLANT
GOWN STRL REIN XL XLG (GOWN DISPOSABLE) ×2 IMPLANT
GUIDEWIRE 0.038 PTFE COATED (WIRE) IMPLANT
GUIDEWIRE ANG ZIPWIRE 038X150 (WIRE) IMPLANT
GUIDEWIRE STR DUAL SENSOR (WIRE) ×2 IMPLANT
IV NS IRRIG 3000ML ARTHROMATIC (IV SOLUTION) ×6 IMPLANT
KIT BALLIN UROMAX 15FX10 (LABEL) IMPLANT
KIT BALLN UROMAX 15FX4 (MISCELLANEOUS) IMPLANT
KIT BALLN UROMAX 26 75X4 (MISCELLANEOUS)
LASER FIBER DISP (UROLOGICAL SUPPLIES) IMPLANT
LASER FIBER DISP 1000U (UROLOGICAL SUPPLIES) IMPLANT
PACK CYSTOSCOPY (CUSTOM PROCEDURE TRAY) ×2 IMPLANT
SET HIGH PRES BAL DIL (LABEL)
SHEATH ACCESS URETERAL 38CM (SHEATH) ×2 IMPLANT
SHEATH ACCESS URETERAL 54CM (SHEATH) ×2 IMPLANT
STENT URET 6FRX26 CONTOUR (STENTS) ×2 IMPLANT

## 2012-10-26 NOTE — Anesthesia Postprocedure Evaluation (Signed)
  Anesthesia Post-op Note  Patient: Steve Harris  Procedure(s) Performed: Procedure(s) (LRB): CYSTOSCOPY WITH RETROGRADE PYELOGRAM, URETEROSCOPY AND STENT PLACEMENT (Right)  Patient Location: PACU  Anesthesia Type: General  Level of Consciousness: awake and alert   Airway and Oxygen Therapy: Patient Spontanous Breathing  Post-op Pain: mild  Post-op Assessment: Post-op Vital signs reviewed, Patient's Cardiovascular Status Stable, Respiratory Function Stable, Patent Airway and No signs of Nausea or vomiting  Last Vitals:  Filed Vitals:   10/26/12 1017  BP: 130/81  Pulse: 84  Temp: 36.2 C  Resp: 9    Post-op Vital Signs: stable   Complications: No apparent anesthesia complications

## 2012-10-26 NOTE — H&P (View-Only) (Signed)
ctive Problems Problems  1. Renal Insufficiency 593.9 2. Ureteral Stone 592.1  History of Present Illness        Steve Harris returns today in f/u.  He has a right ureteral stone and was referred back last week after he was found to have a Cr of 2.3.  He saw Cary Oaster in our office and he had right hydro on Friday on an US and had no obvious stone on KUB.  On CT his stone had been proximal and on our initial KUB I thought the stone might have moved distally but now I am not sure.   A repeat Cr today is 1.7.   He has minimal tenderness in the right lower quadrant and no nausea or gross hematuria.   Past Medical History Problems  1. History of  Esophageal Reflux 530.81  Surgical History Problems  1. History of  Cholecystectomy 2. History of  Hemorrhoidectomy 3. History of  Hernia Repair Right  Current Meds 1. Aspirin 81 MG Oral Tablet; Therapy: (Recorded:02Dec2013) to 2. HYDROmorphone HCl 2 MG Oral Tablet; TAKE 1 TABLET EVERY 4 TO 6 HOURS AS NEEDED;  Therapy: 02Dec2013 to (Evaluate:09Dec2013); Last Rx:02Dec2013 3. Lipitor 20 MG Oral Tablet; Therapy: (Recorded:02Dec2013) to 4. NexIUM 20 MG Oral Capsule Delayed Release; Therapy: (Recorded:02Dec2013) to 5. Ondansetron 4 MG Oral Tablet Dispersible; TAKE 1 TABLET Every 6 hours PRN nausea can take  1-2 tabs; Therapy: 02Dec2013 to (Last Rx:02Dec2013)  Requested for: 02Dec2013 6. Ondansetron HCl 8 MG Oral Tablet; Therapy: 26Nov2013 to 7. Oxycodone-Acetaminophen 5-325 MG Oral Tablet; Therapy: 02Dec2013 to 8. Tamsulosin HCl 0.4 MG Oral Capsule; Therapy: 02Dec2013 to  Allergies Medication  1. No Known Drug Allergies  Family History Problems  1. Family history of  Death In The Family Father cancer and diabetes 2. Paternal history of  Diabetes Mellitus V18.0  Social History Problems  1. Caffeine Use 2. Marital History - Currently Married 3. Occupation: sales 4. History of  Tobacco Use 305.1 smoked 1/2 ppd x 4 years Denied  5.  History of  Alcohol Use  Review of Systems  Constitutional: no fever.  Cardiovascular: no chest pain and no leg swelling.  Respiratory: no shortness of breath.    Vitals Vital Signs [Data Includes: Last 1 Day]  09Dec2013 12:11PM  Blood Pressure: 125 / 78 Heart Rate: 76  Physical Exam Constitutional: Well nourished and well developed . No acute distress.  Pulmonary: No respiratory distress and normal respiratory rhythm and effort.  Cardiovascular: Heart rate and rhythm are normal . No peripheral edema.  Abdomen: The abdomen is soft and nontender. No masses are palpated. No CVA tenderness. No hernias are palpable. No hepatosplenomegaly noted.    Results/Data Urine [Data Includes: Last 1 Day]   09Dec2013  COLOR YELLOW   APPEARANCE CLEAR   SPECIFIC GRAVITY 1.020   pH 5.5   GLUCOSE NEG mg/dL  BILIRUBIN NEG   KETONE NEG mg/dL  BLOOD MOD   PROTEIN NEG mg/dL  UROBILINOGEN 0.2 mg/dL  NITRITE NEG   LEUKOCYTE ESTERASE NEG   SQUAMOUS EPITHELIAL/HPF NONE SEEN   WBC 7-10 WBC/hpf  RBC 3-6 RBC/hpf  BACTERIA NONE SEEN   CRYSTALS NONE SEEN   CASTS NONE SEEN    The following images/tracing/specimen were independently visualized:  I have reviewed his recent US and KUB films.  The following clinical lab reports were reviewed:  I have reviewed his lab work from Dr. Pharr including the Cr levels.    Assessment Assessed  1. Ureteral Stone 592.1   2. Renal Insufficiency 593.9   He stone doesn't appear to have passed.  He has minimal symptoms but persistent renal insufficiency and he had significant hydro on his recent US.   Plan Health Maintenance (V70.0)  1. UA With REFLEX  Done: 09Dec2013 11:52AM Ureteral Stone (592.1)  2. Follow-up Office  Follow-up  Requested for: 09Dec2013 3. Follow-up Schedule Surgery Office  Follow-up  Done: 09Dec2013   I think we need to go ahead and intervene and will set him up for cystoscopy with right RTG pyelogram and possible ureteroscopy and stenting  for later this week.   I reviewed the risks of bleeding, infection, ureteral injury, need for a stent and secondary procedures, thrombotic events and anesthetic complications.   

## 2012-10-26 NOTE — Anesthesia Preprocedure Evaluation (Signed)
Anesthesia Evaluation  Patient identified by MRN, date of birth, ID band Patient awake    Reviewed: Allergy & Precautions, H&P , NPO status , Patient's Chart, lab work & pertinent test results  Airway Mallampati: II TM Distance: >3 FB Neck ROM: Full    Dental No notable dental hx.    Pulmonary neg pulmonary ROS,  breath sounds clear to auscultation  Pulmonary exam normal       Cardiovascular negative cardio ROS  Rhythm:Regular Rate:Normal     Neuro/Psych negative neurological ROS  negative psych ROS   GI/Hepatic Neg liver ROS, GERD-  Medicated,  Endo/Other  negative endocrine ROS  Renal/GU negative Renal ROS  negative genitourinary   Musculoskeletal negative musculoskeletal ROS (+)   Abdominal   Peds negative pediatric ROS (+)  Hematology negative hematology ROS (+)   Anesthesia Other Findings   Reproductive/Obstetrics negative OB ROS                           Anesthesia Physical Anesthesia Plan  ASA: II  Anesthesia Plan: General   Post-op Pain Management:    Induction: Intravenous  Airway Management Planned: LMA  Additional Equipment:   Intra-op Plan:   Post-operative Plan:   Informed Consent: I have reviewed the patients History and Physical, chart, labs and discussed the procedure including the risks, benefits and alternatives for the proposed anesthesia with the patient or authorized representative who has indicated his/her understanding and acceptance.   Dental advisory given  Plan Discussed with: CRNA and Surgeon  Anesthesia Plan Comments:         Anesthesia Quick Evaluation

## 2012-10-26 NOTE — Transfer of Care (Signed)
Immediate Anesthesia Transfer of Care Note  Patient: Steve Harris  Procedure(s) Performed: Procedure(s) (LRB): CYSTOSCOPY WITH RETROGRADE PYELOGRAM, URETEROSCOPY AND STENT PLACEMENT (Right)  Patient Location: Patient transported to PACU with oxygen via face mask at 4 Liters / Min  Anesthesia Type: General  Level of Consciousness: awake and alert   Airway & Oxygen Therapy: Patient Spontanous Breathing and Patient connected to face mask oxygen  Post-op Assessment: Report given to PACU RN and Post -op Vital signs reviewed and stable  Post vital signs: Reviewed and stable  Dentition: Teeth and oropharynx remain in pre-op condition  Complications: No apparent anesthesia complications

## 2012-10-26 NOTE — Interval H&P Note (Signed)
History and Physical Interval Note:  10/26/2012 9:14 AM  Steve Harris  has presented today for surgery, with the diagnosis of RIGHT RENAL FILLING DEFECT   The various methods of treatment have been discussed with the patient and family. After consideration of risks, benefits and other options for treatment, the patient has consented to  Procedure(s) (LRB) with comments: URETEROSCOPY (Right) - RIGHT URETERSCOPY POSSIBLE BIOPSY as a surgical intervention .  The patient's history has been reviewed, patient examined, no change in status, stable for surgery.  I have reviewed the patient's chart and labs.  Questions were answered to the patient's satisfaction.     Cindia Hustead,[name]  He returns today to assess the right renal filling defect that was noted on his recent retrograde pyelogram.

## 2012-10-26 NOTE — Anesthesia Procedure Notes (Signed)
Procedure Name: LMA Insertion Date/Time: 10/26/2012 9:26 AM Performed by: Fran Lowes Pre-anesthesia Checklist: Patient identified, Emergency Drugs available, Suction available and Patient being monitored Patient Re-evaluated:Patient Re-evaluated prior to inductionOxygen Delivery Method: Circle System Utilized Preoxygenation: Pre-oxygenation with 100% oxygen Intubation Type: IV induction Ventilation: Mask ventilation without difficulty LMA: LMA inserted LMA Size: 4.0 Number of attempts: 1 Airway Equipment and Method: bite block Placement Confirmation: positive ETCO2 Tube secured with: Tape Dental Injury: Teeth and Oropharynx as per pre-operative assessment

## 2012-10-26 NOTE — Brief Op Note (Signed)
10/26/2012  10:06 AM  PATIENT:  Kathi Simpers  66 y.o. male  PRE-OPERATIVE DIAGNOSIS:  RIGHT RENAL FILLING DEFECT   POST-OPERATIVE DIAGNOSIS:  Same without obvious lesion found  PROCEDURE:  Procedure(s) (LRB) with comments: CYSTOSCOPY WITH REMOVAL AND REPLACEMENT OF RIGHT JJ STENT, RIGHT RETROGRADE PYELOGRAM WITH INTERPRETATION,URETEROSCOPY (Right) - BARBOTAGE CYTOLOGY OF THE RIGHT RENAL PELVIS  SURGEON:  Surgeon(s) and Role:    * Anner Crete, MD - Primary  PHYSICIAN ASSISTANT:   ASSISTANTS: none   ANESTHESIA:   general  EBL:  Total I/O In: 200 [I.V.:200] Out: -   BLOOD ADMINISTERED:none  DRAINS: 6X26 right JJ stent with string.    LOCAL MEDICATIONS USED:  NONE  SPECIMEN:  Source of Specimen:  right renal pelvic washings.   DISPOSITION OF SPECIMEN:  PATHOLOGY  COUNTS:  YES  TOURNIQUET:  * No tourniquets in log *  DICTATION: .Other Dictation: Dictation Number [id number]  PLAN OF CARE: Discharge to home after PACU  PATIENT DISPOSITION:  PACU - hemodynamically stable.   Delay start of Pharmacological VTE agent (>24hrs) due to surgical blood loss or risk of bleeding: not applicable

## 2012-10-27 ENCOUNTER — Encounter (HOSPITAL_BASED_OUTPATIENT_CLINIC_OR_DEPARTMENT_OTHER): Payer: Self-pay | Admitting: Urology

## 2012-10-27 NOTE — Op Note (Signed)
NAMEMarland Kitchen  ALUCARD, FEARNOW NO.:  [id number]  MEDICAL RECORD NO.:  [id number]  LOCATION:                               FACILITY:  [facility]  PHYSICIAN:  Jahmari Esbenshade. Annabell Howells, M.D.    DATE OF BIRTH:  Dec 23, 1945  DATE OF PROCEDURE:  10/26/2012 DATE OF DISCHARGE:  10/26/2012                              OPERATIVE REPORT   PROCEDURES:  Cystoscopy with removal and replacement of right double-J stent, right ureterostomy with retrograde pyelogram and interpretation, right renal pelvic washings.  PREOPERATIVE DIAGNOSIS:  Right renal pelvic filling defect.  POSTOPERATIVE DIAGNOSIS:  Right renal pelvic filling defect without obvious etiology.  SURGEON:  Quashaun Lazalde. Annabell Howells, MD  ANESTHESIA:  General.  SPECIMEN:  Right renal pelvic washings.  DRAINS:  A 6-French x 26 cm right double-J stent.  BLOOD LOSS:  Minimal.  COMPLICATIONS:  None.  INDICATIONS:  Mr. Luhman is a 66 year old white male, who had a stone last week.  A retrograde pyelogram performed during endoscopic retrieval revealed a filling defect in the renal pelvis approximately 1.5 x 1 cm in size.  At that time attempt to perform ureteroscopy was unsuccessful due to tortuosity in the proximal ureter with some extravasation upon passage of wire, however, was able to successfully get a stent up to the kidney and returns today for further evaluation.  FINDINGS OF PROCEDURE:  He was given Cipro 400 mg.  He was taken to the operating room where general anesthetic was induced.  He was placed in lithotomy position and fitted with PAS hose.  His perineum and genitalia were prepped with Betadine solution.  He was draped in the usual sterile fashion.  Cystoscopy was performed using a 22-French scope and a 12-degree lens. The scope was passed per urethra without difficulty.  The stent was visualized, grasped, and pulled the urethral meatus where guidewire was passed to the kidney under fluoroscopic guidance.  The stent was  removed over the wire.  Initially a 35 cm digital access sheath was passed over the wire to just below the kidney without difficulty.  The inner core and wire were removed and digital flexible ureteroscope was then passed through the sheath to the kidney.  Inspection revealed some mild mucosal tearing proximally and mild tortuosity at the UPJ.  Thorough inspection of the internal collecting system originally revealed no obvious filling defects.  There was little blood clot adherent anteriorly in the pelvis, however, it was difficult to visualize.  Contrast was instilled through the scope and this study demonstrated persistence of the 1 x 1.5 cm triangular filling defect in the renal pelvis.  At this point, access was difficult, so a 35 cm sheath was removed and a 55 cm sheath was placed.  A digital flexible ureteroscope was also used because the initial scope appeared to have limited flexion.  With this additional scope, I was able to get the proper angle on the area of concern.  There was small amount of adherent clot but no obvious mucosal lesion was identified or anything else to explain the filling defect.  At this point, saline washings were obtained from the renal pelvis.  Once a thorough inspection had been completed  and no worrisome lesion was identified, a guidewire was passed through the scope and sheath were removed over the guidewire.  Inspection revealed some mild mucosal tearing of the proximal urethra but no other significant injury, but it was felt that a stent was indicated.  The cystoscope was then reinserted over the wire and a 6-French 26 cm double-J stent with string was inserted without difficulty to the kidney.  The wire was removed leaving good coil in the kidney, a good coil in the bladder.  The patient was given a B and O suppository.  He was taken down from lithotomy position.  His anesthetic was reversed.  He was moved to recovery room in stable  condition.  My only current explanation for the filling defect is possibly a blood clot in this area or unusual filling of air bubbles in the renal pelvis, however, with instillation of contrast, the filling defect did not distort as I would expect with air. Further evaluation will be considered based on the cytology result.     Excell Seltzer. Annabell Howells, M.D.     JJW/MEDQ  D:  10/26/2012  T:  10/26/2012  Job:  [id number]

## 2012-11-11 DIAGNOSIS — H4011X0 Primary open-angle glaucoma, stage unspecified: Secondary | ICD-10-CM | POA: Diagnosis not present

## 2012-11-11 DIAGNOSIS — H409 Unspecified glaucoma: Secondary | ICD-10-CM | POA: Diagnosis not present

## 2012-11-19 DIAGNOSIS — L909 Atrophic disorder of skin, unspecified: Secondary | ICD-10-CM | POA: Diagnosis not present

## 2012-11-19 DIAGNOSIS — L819 Disorder of pigmentation, unspecified: Secondary | ICD-10-CM | POA: Diagnosis not present

## 2012-11-19 DIAGNOSIS — L570 Actinic keratosis: Secondary | ICD-10-CM | POA: Diagnosis not present

## 2012-11-19 DIAGNOSIS — L821 Other seborrheic keratosis: Secondary | ICD-10-CM | POA: Diagnosis not present

## 2012-11-19 DIAGNOSIS — L259 Unspecified contact dermatitis, unspecified cause: Secondary | ICD-10-CM | POA: Diagnosis not present

## 2012-11-19 DIAGNOSIS — D1801 Hemangioma of skin and subcutaneous tissue: Secondary | ICD-10-CM | POA: Diagnosis not present

## 2012-11-19 DIAGNOSIS — L919 Hypertrophic disorder of the skin, unspecified: Secondary | ICD-10-CM | POA: Diagnosis not present

## 2012-11-19 DIAGNOSIS — Z85828 Personal history of other malignant neoplasm of skin: Secondary | ICD-10-CM | POA: Diagnosis not present

## 2012-12-21 DIAGNOSIS — D72829 Elevated white blood cell count, unspecified: Secondary | ICD-10-CM | POA: Diagnosis not present

## 2012-12-21 DIAGNOSIS — R1031 Right lower quadrant pain: Secondary | ICD-10-CM | POA: Diagnosis not present

## 2012-12-21 DIAGNOSIS — K5900 Constipation, unspecified: Secondary | ICD-10-CM | POA: Diagnosis not present

## 2012-12-25 ENCOUNTER — Emergency Department (HOSPITAL_COMMUNITY)
Admission: EM | Admit: 2012-12-25 | Discharge: 2012-12-25 | Disposition: A | Discharge status: Home / Self Care | Payer: Medicare Other | Attending: Emergency Medicine | Admitting: Emergency Medicine

## 2012-12-25 ENCOUNTER — Encounter (HOSPITAL_COMMUNITY): Payer: Self-pay | Admitting: *Deleted

## 2012-12-25 ENCOUNTER — Emergency Department (HOSPITAL_COMMUNITY): Payer: Medicare Other

## 2012-12-25 DIAGNOSIS — R630 Anorexia: Secondary | ICD-10-CM | POA: Diagnosis not present

## 2012-12-25 DIAGNOSIS — K219 Gastro-esophageal reflux disease without esophagitis: Secondary | ICD-10-CM | POA: Insufficient documentation

## 2012-12-25 DIAGNOSIS — E785 Hyperlipidemia, unspecified: Secondary | ICD-10-CM | POA: Insufficient documentation

## 2012-12-25 DIAGNOSIS — Z9089 Acquired absence of other organs: Secondary | ICD-10-CM | POA: Insufficient documentation

## 2012-12-25 DIAGNOSIS — Z87448 Personal history of other diseases of urinary system: Secondary | ICD-10-CM | POA: Diagnosis not present

## 2012-12-25 DIAGNOSIS — R110 Nausea: Secondary | ICD-10-CM | POA: Insufficient documentation

## 2012-12-25 DIAGNOSIS — Z7982 Long term (current) use of aspirin: Secondary | ICD-10-CM | POA: Insufficient documentation

## 2012-12-25 DIAGNOSIS — Z9889 Other specified postprocedural states: Secondary | ICD-10-CM | POA: Insufficient documentation

## 2012-12-25 DIAGNOSIS — N200 Calculus of kidney: Secondary | ICD-10-CM | POA: Insufficient documentation

## 2012-12-25 DIAGNOSIS — Z8719 Personal history of other diseases of the digestive system: Secondary | ICD-10-CM | POA: Insufficient documentation

## 2012-12-25 DIAGNOSIS — N281 Cyst of kidney, acquired: Secondary | ICD-10-CM | POA: Diagnosis not present

## 2012-12-25 DIAGNOSIS — Z79899 Other long term (current) drug therapy: Secondary | ICD-10-CM | POA: Diagnosis not present

## 2012-12-25 DIAGNOSIS — R6883 Chills (without fever): Secondary | ICD-10-CM | POA: Diagnosis not present

## 2012-12-25 DIAGNOSIS — N201 Calculus of ureter: Secondary | ICD-10-CM | POA: Diagnosis not present

## 2012-12-25 LAB — POCT I-STAT, CHEM 8
BUN: 20 mg/dL (ref 6–23)
Calcium, Ion: 1.19 mmol/L (ref 1.13–1.30)
Chloride: 104 mEq/L (ref 96–112)
Creatinine, Ser: 1.6 mg/dL — ABNORMAL HIGH (ref 0.50–1.35)
Glucose, Bld: 115 mg/dL — ABNORMAL HIGH (ref 70–99)
HCT: 42 % (ref 39.0–52.0)
Hemoglobin: 14.3 g/dL (ref 13.0–17.0)
Potassium: 4.3 mEq/L (ref 3.5–5.1)
Sodium: 139 mEq/L (ref 135–145)
TCO2: 25 mmol/L (ref 0–100)

## 2012-12-25 LAB — CBC WITH DIFFERENTIAL/PLATELET
Basophils Absolute: 0 10*3/uL (ref 0.0–0.1)
Eosinophils Absolute: 0 10*3/uL (ref 0.0–0.7)
Eosinophils Relative: 0 % (ref 0–5)
MCH: 30.7 pg (ref 26.0–34.0)
MCHC: 34.3 g/dL (ref 30.0–36.0)
MCV: 89.4 fL (ref 78.0–100.0)
Monocytes Absolute: 1.1 10*3/uL — ABNORMAL HIGH (ref 0.1–1.0)
Platelets: 268 10*3/uL (ref 150–400)
RDW: 12.6 % (ref 11.5–15.5)

## 2012-12-25 LAB — URINE MICROSCOPIC-ADD ON

## 2012-12-25 LAB — URINALYSIS, ROUTINE W REFLEX MICROSCOPIC
Bilirubin Urine: NEGATIVE
Ketones, ur: NEGATIVE mg/dL
Protein, ur: NEGATIVE mg/dL
Urobilinogen, UA: 1 mg/dL (ref 0.0–1.0)

## 2012-12-25 MED ORDER — ONDANSETRON HCL 4 MG PO TABS: 4.0000 mg | ORAL_TABLET | Freq: Four times a day (QID) | ORAL | Status: DC

## 2012-12-25 MED ORDER — OXYCODONE-ACETAMINOPHEN 5-325 MG PO TABS
1.0000 | ORAL_TABLET | Freq: Once | ORAL | Status: AC
  Administered 2012-12-25: 1 via ORAL
  Filled 2012-12-25: qty 1

## 2012-12-25 MED ORDER — TAMSULOSIN HCL 0.4 MG PO CAPS: 0.4000 mg | ORAL_CAPSULE | Freq: Every day | ORAL | Status: DC | PRN

## 2012-12-25 MED ORDER — OXYCODONE-ACETAMINOPHEN 5-325 MG PO TABS: 1.0000 | ORAL_TABLET | Freq: Four times a day (QID) | ORAL | Status: DC | PRN

## 2012-12-25 NOTE — ED Notes (Signed)
Pt states for the past 10-11 days he's been having R sided groin pain, has worsened over time where now pain is severe, states has a hx of kidney stone removal in December, also had to have a blood clot removed after and stent placed in same area. Pt states nauseous but denies vomiting or diarrhea. Pt denies burning or freq w/ urination.

## 2012-12-25 NOTE — ED Notes (Signed)
Pt aware of the need for a urine sample. Urinal at bedside. 

## 2012-12-25 NOTE — ED Notes (Signed)
Floor for testing

## 2012-12-25 NOTE — ED Provider Notes (Signed)
History     CSN: [id number]  Arrival date & time 12/25/12  1549   First MD Initiated Contact with Patient 12/25/12 1629      Chief Complaint  Patient presents with  . Groin Pain    right  . Nausea    (Consider location/radiation/quality/duration/timing/severity/associated sxs/prior treatment) HPI   67 year old male with history of  kidney stones, requiring stenting, and history of kidney filling defect on the right side presents complaining of right groin pain. Patient reports gradual onset of pain to his right groin for the past 10 days. Describe pain as a sharp and throbbing sensation, constant, nonradiating, and is getting progressively worse. He endorsed nausea, and chills, and decreased appetite. He denies fever, headache, chest pain, shortness of breath, vomiting, diarrhea, dysuria, back pain, scrotal swelling, or rash. He denies any specific trauma. Pain feels similar to his prior kidney stones that he has the past. State has a kidney stone removal last December. He also had a blood clot in his kidney that was removed and had a stent placed by Dr. Annabell Howells.  He was seen by his PCP last week for same complaint.  Had an xray performed and was diagnosed with constipation. There was mention that his WBC was elevated. Pt was given stool softener which he took without changes in his sxs.  Sts there has been no changes to his bowel movement.     Past Medical History  Diagnosis Date  . Hyperlipidemia   . GERD (gastroesophageal reflux disease)   . Kidney filling defect RIGHT  . History of kidney stones     Past Surgical History  Procedure Laterality Date  . Hemorrhoid surgery    . Laparoscopic cholecystectomy  12-26-2003    AND LAPAROSCOPIC RIGHT INGUINAL HERNIA REPAIR W/ MESH  . Hemorrhoid surgery  04-14-2003  . Cystoscopy with retrograde pyelogram, ureteroscopy and stent placement  10/21/2012    Procedure: CYSTOSCOPY WITH RETROGRADE PYELOGRAM, URETEROSCOPY AND STENT PLACEMENT;   Surgeon: Anner Crete, MD;  Location: [facility];  Service: Urology;  Laterality: Right;  no ureteroscopy/  bladder stone removal  . Cystoscopy with retrograde pyelogram, ureteroscopy and stent placement  10/26/2012    Procedure: CYSTOSCOPY WITH RETROGRADE PYELOGRAM, URETEROSCOPY AND STENT PLACEMENT;  Surgeon: Anner Crete, MD;  Location: [facility];  Service: Urology;  Laterality: Right;    No family history on file.  History  Substance Use Topics  . Smoking status: Never Smoker   . Smokeless tobacco: Never Used  . Alcohol Use: No      Review of Systems  Constitutional:       A complete 10 system review of systems was obtained and all systems are negative except as noted in the HPI and PMH.    Allergies  Review of patient's allergies indicates no known allergies.  Home Medications   Current Outpatient Rx  Name  Route  Sig  Dispense  Refill  . aspirin EC 81 MG tablet   Oral   Take 81 mg by mouth every morning.         . cholecalciferol (VITAMIN D) 1000 UNITS tablet   Oral   Take 1,000 Units by mouth every morning.         . dorzolamide (TRUSOPT) 2 % ophthalmic solution   Both Eyes   Place 1 drop into both eyes 2 (two) times daily.         Marland Kitchen latanoprost (XALATAN) 0.005 % ophthalmic solution   Both  Eyes   Place 1 drop into both eyes at bedtime.         Marland Kitchen omeprazole (PRILOSEC) 20 MG capsule   Oral   Take 20 mg by mouth every morning.         Marland Kitchen oxyCODONE-acetaminophen (PERCOCET/ROXICET) 5-325 MG per tablet   Oral   Take 2 tablets by mouth every 4 (four) hours as needed for pain. For kidney stone pain         . pravastatin (PRAVACHOL) 20 MG tablet   Oral   Take 20 mg by mouth at bedtime.         . Tamsulosin HCl (FLOMAX) 0.4 MG CAPS   Oral   Take 0.4 mg by mouth daily as needed. Kidney stones           BP 127/74  Pulse 75  Temp(Src) 98.9 F (37.2 C) (Oral)  Resp 18  SpO2 96%  Physical Exam  Nursing note  and vitals reviewed. Constitutional: He is oriented to person, place, and time. He appears well-developed and well-nourished. No distress.  Awake, alert, nontoxic appearance  HENT:  Head: Atraumatic.  Eyes: Conjunctivae are normal. Right eye exhibits no discharge. Left eye exhibits no discharge.  Neck: Normal range of motion. Neck supple.  Cardiovascular: Normal rate and regular rhythm.   Pulmonary/Chest: Effort normal. No respiratory distress. He exhibits no tenderness.  Abdominal: Soft. There is no tenderness. There is no rebound.  No CVA tenderness.  No hernia noted  Genitourinary: Penis normal.  Musculoskeletal: He exhibits no edema and no tenderness.  ROM appears intact, no obvious focal weakness  Neurological: He is alert and oriented to person, place, and time.  Skin: Skin is warm and dry. No rash noted.  Psychiatric: He has a normal mood and affect.    ED Course  Procedures (including critical care time)  Results for orders placed during the hospital encounter of 12/25/12  URINALYSIS, ROUTINE W REFLEX MICROSCOPIC      Result Value Range   Color, Urine YELLOW  YELLOW   APPearance CLEAR  CLEAR   Specific Gravity, Urine 1.027  1.005 - 1.030   pH 5.5  5.0 - 8.0   Glucose, UA NEGATIVE  NEGATIVE mg/dL   Hgb urine dipstick TRACE (*) NEGATIVE   Bilirubin Urine NEGATIVE  NEGATIVE   Ketones, ur NEGATIVE  NEGATIVE mg/dL   Protein, ur NEGATIVE  NEGATIVE mg/dL   Urobilinogen, UA 1.0  0.0 - 1.0 mg/dL   Nitrite NEGATIVE  NEGATIVE   Leukocytes, UA NEGATIVE  NEGATIVE  CBC WITH DIFFERENTIAL      Result Value Range   WBC 10.8 (*) 4.0 - 10.5 K/uL   RBC 4.53  4.22 - 5.81 MIL/uL   Hemoglobin 13.9  13.0 - 17.0 g/dL   HCT 56.2  13.0 - 86.5 %   MCV 89.4  78.0 - 100.0 fL   MCH 30.7  26.0 - 34.0 pg   MCHC 34.3  30.0 - 36.0 g/dL   RDW 78.4  69.6 - 29.5 %   Platelets 268  150 - 400 K/uL   Neutrophils Relative 76  43 - 77 %   Neutro Abs 8.2 (*) 1.7 - 7.7 K/uL   Lymphocytes Relative 14   12 - 46 %   Lymphs Abs 1.5  0.7 - 4.0 K/uL   Monocytes Relative 10  3 - 12 %   Monocytes Absolute 1.1 (*) 0.1 - 1.0 K/uL   Eosinophils Relative 0  0 - 5 %  Eosinophils Absolute 0.0  0.0 - 0.7 K/uL   Basophils Relative 0  0 - 1 %   Basophils Absolute 0.0  0.0 - 0.1 K/uL  URINE MICROSCOPIC-ADD ON      Result Value Range   WBC, UA 0-2  <3 WBC/hpf   Urine-Other MUCOUS PRESENT    POCT I-STAT, CHEM 8      Result Value Range   Sodium 139  135 - 145 mEq/L   Potassium 4.3  3.5 - 5.1 mEq/L   Chloride 104  96 - 112 mEq/L   BUN 20  6 - 23 mg/dL   Creatinine, Ser 1.61 (*) 0.50 - 1.35 mg/dL   Glucose, Bld 096 (*) 70 - 99 mg/dL   Calcium, Ion 0.45  4.09 - 1.30 mmol/L   TCO2 25  0 - 100 mmol/L   Hemoglobin 14.3  13.0 - 17.0 g/dL   HCT 81.1  91.4 - 78.2 %   Ct Abdomen Pelvis Wo Contrast  12/25/2012  *RADIOLOGY REPORT*  Clinical Data: Right flank, abdominal and groin pain.  CT ABDOMEN AND PELVIS WITHOUT CONTRAST  Technique:  Multidetector CT imaging of the abdomen and pelvis was performed following the standard protocol without intravenous contrast.  Comparison: 10/05/2012  Findings: A 3 mm calculus at the right UPJ is noted with moderate to severe right hydronephrosis. No other urinary calculi are identified. Bilateral renal cysts are present.  The liver, spleen, adrenal glands and pancreas are unremarkable. The patient is status post cholecystectomy.  Please note that parenchymal abnormalities may be missed as intravenous contrast was not administered.  No free fluid, enlarged lymph nodes, biliary dilation or abdominal aortic aneurysm identified.  Colonic diverticulosis identified without evidence of diverticulitis. The appendix is normal. The bladder is not well distended. Prostate enlargement is present.  No acute or suspicious bony abnormalities are identified.  IMPRESSION: 3 mm right UPJ calculus with moderate to severe right hydronephrosis.   Original Report Authenticated By: Harmon Pier, M.D.       4:59 PM Patient was seen and evaluate by me for his right groin pain. Pain felt similar to his prior kidney stones. On exam patient has no CVA tenderness, no hernia noted, no overlying skin changes, normal hip flexion and extension, and abdomen is nontender on exam. Since patient has prior kidney stones, will obtain abdomen and pelvis CT without contrast for evaluation. We'll check for his renal function, white count, and UA. Pain medication given. Care discussed with my attending.  6:29 PM Abdominal and pelvis CT scans shows a 3 mm right UPJ With moderate to severe right hydronephrosis. Patient has a mildly elevated white count of 10.8 with no shift. His creatinine is mildly elevated at 1.6.  UA shows no evidence of urinary tract infection. Patient prefers to be discharged and agrees to follow up with his urologist at his earliest convenience. Return precautions discussed. Will discharge patient with pain medication, antinausea medication, and Flomax. Patient encouraged to continue with stool softener as narcotic pain medication can cause constipation.  BP 127/74  Pulse 75  Temp(Src) 98.9 F (37.2 C) (Oral)  Resp 18  SpO2 96%  I have reviewed nursing notes and vital signs. I personally reviewed the imaging tests through PACS system  I reviewed available ER/hospitalization records thought the EMR  1. Kidney stone, right MDM          Fayrene Helper, PA-C 12/25/12 1905

## 2012-12-26 NOTE — ED Provider Notes (Signed)
Medical screening examination/treatment/procedure(s) were performed by non-physician practitioner and as supervising physician I was immediately available for consultation/collaboration.  Raeford Razor, MD 12/26/12 [phone]

## 2012-12-30 DIAGNOSIS — N200 Calculus of kidney: Secondary | ICD-10-CM | POA: Diagnosis not present

## 2012-12-30 DIAGNOSIS — R109 Unspecified abdominal pain: Secondary | ICD-10-CM | POA: Diagnosis not present

## 2012-12-30 DIAGNOSIS — R1031 Right lower quadrant pain: Secondary | ICD-10-CM | POA: Diagnosis not present

## 2013-01-24 DIAGNOSIS — N200 Calculus of kidney: Secondary | ICD-10-CM | POA: Diagnosis not present

## 2013-04-25 ENCOUNTER — Other Ambulatory Visit: Payer: Self-pay | Admitting: Dermatology

## 2013-04-25 DIAGNOSIS — C44519 Basal cell carcinoma of skin of other part of trunk: Secondary | ICD-10-CM | POA: Diagnosis not present

## 2013-04-25 DIAGNOSIS — Z85828 Personal history of other malignant neoplasm of skin: Secondary | ICD-10-CM | POA: Diagnosis not present

## 2013-04-25 DIAGNOSIS — D485 Neoplasm of uncertain behavior of skin: Secondary | ICD-10-CM | POA: Diagnosis not present

## 2013-05-20 ENCOUNTER — Ambulatory Visit (INDEPENDENT_AMBULATORY_CARE_PROVIDER_SITE_OTHER): Payer: Medicare Other | Admitting: Ophthalmology

## 2013-05-20 DIAGNOSIS — H43819 Vitreous degeneration, unspecified eye: Secondary | ICD-10-CM

## 2013-05-20 DIAGNOSIS — D3130 Benign neoplasm of unspecified choroid: Secondary | ICD-10-CM

## 2013-05-25 DIAGNOSIS — K219 Gastro-esophageal reflux disease without esophagitis: Secondary | ICD-10-CM | POA: Diagnosis not present

## 2013-05-25 DIAGNOSIS — Z006 Encounter for examination for normal comparison and control in clinical research program: Secondary | ICD-10-CM | POA: Diagnosis not present

## 2013-05-25 DIAGNOSIS — E7800 Pure hypercholesterolemia, unspecified: Secondary | ICD-10-CM | POA: Diagnosis not present

## 2013-05-25 DIAGNOSIS — J019 Acute sinusitis, unspecified: Secondary | ICD-10-CM | POA: Diagnosis not present

## 2013-07-05 DIAGNOSIS — R0789 Other chest pain: Secondary | ICD-10-CM | POA: Diagnosis not present

## 2013-07-05 DIAGNOSIS — J309 Allergic rhinitis, unspecified: Secondary | ICD-10-CM | POA: Diagnosis not present

## 2013-07-05 DIAGNOSIS — R05 Cough: Secondary | ICD-10-CM | POA: Diagnosis not present

## 2013-07-05 DIAGNOSIS — J069 Acute upper respiratory infection, unspecified: Secondary | ICD-10-CM | POA: Diagnosis not present

## 2013-07-05 DIAGNOSIS — R059 Cough, unspecified: Secondary | ICD-10-CM | POA: Diagnosis not present

## 2013-07-28 DIAGNOSIS — H2510 Age-related nuclear cataract, unspecified eye: Secondary | ICD-10-CM | POA: Diagnosis not present

## 2013-07-28 DIAGNOSIS — H02409 Unspecified ptosis of unspecified eyelid: Secondary | ICD-10-CM | POA: Diagnosis not present

## 2013-07-28 DIAGNOSIS — H04129 Dry eye syndrome of unspecified lacrimal gland: Secondary | ICD-10-CM | POA: Diagnosis not present

## 2013-07-28 DIAGNOSIS — H43399 Other vitreous opacities, unspecified eye: Secondary | ICD-10-CM | POA: Diagnosis not present

## 2013-07-28 DIAGNOSIS — H409 Unspecified glaucoma: Secondary | ICD-10-CM | POA: Diagnosis not present

## 2013-07-28 DIAGNOSIS — H524 Presbyopia: Secondary | ICD-10-CM | POA: Diagnosis not present

## 2013-07-28 DIAGNOSIS — H4011X0 Primary open-angle glaucoma, stage unspecified: Secondary | ICD-10-CM | POA: Diagnosis not present

## 2013-08-04 DIAGNOSIS — Z23 Encounter for immunization: Secondary | ICD-10-CM | POA: Diagnosis not present

## 2013-09-30 DIAGNOSIS — M659 Synovitis and tenosynovitis, unspecified: Secondary | ICD-10-CM | POA: Diagnosis not present

## 2013-10-10 DIAGNOSIS — H409 Unspecified glaucoma: Secondary | ICD-10-CM | POA: Diagnosis not present

## 2013-10-10 DIAGNOSIS — H4011X0 Primary open-angle glaucoma, stage unspecified: Secondary | ICD-10-CM | POA: Diagnosis not present

## 2013-10-24 DIAGNOSIS — H4011X0 Primary open-angle glaucoma, stage unspecified: Secondary | ICD-10-CM | POA: Diagnosis not present

## 2013-10-24 DIAGNOSIS — H409 Unspecified glaucoma: Secondary | ICD-10-CM | POA: Diagnosis not present

## 2013-11-22 DIAGNOSIS — L821 Other seborrheic keratosis: Secondary | ICD-10-CM | POA: Diagnosis not present

## 2013-11-22 DIAGNOSIS — L570 Actinic keratosis: Secondary | ICD-10-CM | POA: Diagnosis not present

## 2013-11-22 DIAGNOSIS — L909 Atrophic disorder of skin, unspecified: Secondary | ICD-10-CM | POA: Diagnosis not present

## 2013-11-22 DIAGNOSIS — D1801 Hemangioma of skin and subcutaneous tissue: Secondary | ICD-10-CM | POA: Diagnosis not present

## 2013-11-22 DIAGNOSIS — L919 Hypertrophic disorder of the skin, unspecified: Secondary | ICD-10-CM | POA: Diagnosis not present

## 2013-11-22 DIAGNOSIS — D239 Other benign neoplasm of skin, unspecified: Secondary | ICD-10-CM | POA: Diagnosis not present

## 2013-11-22 DIAGNOSIS — Z85828 Personal history of other malignant neoplasm of skin: Secondary | ICD-10-CM | POA: Diagnosis not present

## 2013-11-22 DIAGNOSIS — L259 Unspecified contact dermatitis, unspecified cause: Secondary | ICD-10-CM | POA: Diagnosis not present

## 2013-12-15 DIAGNOSIS — H409 Unspecified glaucoma: Secondary | ICD-10-CM | POA: Diagnosis not present

## 2013-12-15 DIAGNOSIS — H4011X0 Primary open-angle glaucoma, stage unspecified: Secondary | ICD-10-CM | POA: Diagnosis not present

## 2014-03-06 DIAGNOSIS — H409 Unspecified glaucoma: Secondary | ICD-10-CM | POA: Diagnosis not present

## 2014-03-06 DIAGNOSIS — H4011X0 Primary open-angle glaucoma, stage unspecified: Secondary | ICD-10-CM | POA: Diagnosis not present

## 2014-04-10 DIAGNOSIS — N200 Calculus of kidney: Secondary | ICD-10-CM | POA: Diagnosis not present

## 2014-04-10 DIAGNOSIS — N281 Cyst of kidney, acquired: Secondary | ICD-10-CM | POA: Diagnosis not present

## 2014-04-18 DIAGNOSIS — N200 Calculus of kidney: Secondary | ICD-10-CM | POA: Diagnosis not present

## 2014-05-22 ENCOUNTER — Ambulatory Visit (INDEPENDENT_AMBULATORY_CARE_PROVIDER_SITE_OTHER): Payer: Medicare Other | Admitting: Ophthalmology

## 2014-05-22 DIAGNOSIS — H2510 Age-related nuclear cataract, unspecified eye: Secondary | ICD-10-CM

## 2014-05-22 DIAGNOSIS — D3130 Benign neoplasm of unspecified choroid: Secondary | ICD-10-CM | POA: Diagnosis not present

## 2014-05-22 DIAGNOSIS — H43819 Vitreous degeneration, unspecified eye: Secondary | ICD-10-CM

## 2014-05-24 DIAGNOSIS — Z87442 Personal history of urinary calculi: Secondary | ICD-10-CM | POA: Diagnosis not present

## 2014-05-29 DIAGNOSIS — Z0000 Encounter for general adult medical examination without abnormal findings: Secondary | ICD-10-CM | POA: Diagnosis not present

## 2014-05-29 DIAGNOSIS — E7800 Pure hypercholesterolemia, unspecified: Secondary | ICD-10-CM | POA: Diagnosis not present

## 2014-06-22 DIAGNOSIS — Z85828 Personal history of other malignant neoplasm of skin: Secondary | ICD-10-CM | POA: Diagnosis not present

## 2014-06-22 DIAGNOSIS — L918 Other hypertrophic disorders of the skin: Secondary | ICD-10-CM | POA: Diagnosis not present

## 2014-06-22 DIAGNOSIS — L738 Other specified follicular disorders: Secondary | ICD-10-CM | POA: Diagnosis not present

## 2014-06-22 DIAGNOSIS — L570 Actinic keratosis: Secondary | ICD-10-CM | POA: Diagnosis not present

## 2014-06-22 DIAGNOSIS — L908 Other atrophic disorders of skin: Secondary | ICD-10-CM | POA: Diagnosis not present

## 2014-06-22 DIAGNOSIS — D1801 Hemangioma of skin and subcutaneous tissue: Secondary | ICD-10-CM | POA: Diagnosis not present

## 2014-06-22 DIAGNOSIS — L819 Disorder of pigmentation, unspecified: Secondary | ICD-10-CM | POA: Diagnosis not present

## 2014-06-22 DIAGNOSIS — L678 Other hair color and hair shaft abnormalities: Secondary | ICD-10-CM | POA: Diagnosis not present

## 2014-06-23 DIAGNOSIS — N200 Calculus of kidney: Secondary | ICD-10-CM | POA: Diagnosis not present

## 2014-06-23 DIAGNOSIS — E7800 Pure hypercholesterolemia, unspecified: Secondary | ICD-10-CM | POA: Diagnosis not present

## 2014-06-23 DIAGNOSIS — K219 Gastro-esophageal reflux disease without esophagitis: Secondary | ICD-10-CM | POA: Diagnosis not present

## 2014-06-23 DIAGNOSIS — C4491 Basal cell carcinoma of skin, unspecified: Secondary | ICD-10-CM | POA: Diagnosis not present

## 2014-06-23 DIAGNOSIS — Z0000 Encounter for general adult medical examination without abnormal findings: Secondary | ICD-10-CM | POA: Diagnosis not present

## 2014-07-31 DIAGNOSIS — H4011X0 Primary open-angle glaucoma, stage unspecified: Secondary | ICD-10-CM | POA: Diagnosis not present

## 2014-07-31 DIAGNOSIS — H409 Unspecified glaucoma: Secondary | ICD-10-CM | POA: Diagnosis not present

## 2014-07-31 DIAGNOSIS — D3130 Benign neoplasm of unspecified choroid: Secondary | ICD-10-CM | POA: Diagnosis not present

## 2014-07-31 DIAGNOSIS — H2510 Age-related nuclear cataract, unspecified eye: Secondary | ICD-10-CM | POA: Diagnosis not present

## 2014-07-31 DIAGNOSIS — H25019 Cortical age-related cataract, unspecified eye: Secondary | ICD-10-CM | POA: Diagnosis not present

## 2014-10-07 IMAGING — CT CT ABD-PELV W/O CM
1 of 2 series · 15 of 32 positions shown, 19 images · non-contrast
Comparison: 10/05/2012

CLINICAL DATA: Right flank, abdominal and groin pain.

CT ABDOMEN AND PELVIS WITHOUT CONTRAST
TECHNIQUE: Multidetector CT imaging of the abdomen and pelvis was
performed following the standard protocol without intravenous
contrast.

[Series 2: abd/pel w/o · axial · non-contrast · 0.74mm/px · z∈[-492,-58]mm · 15 of 95 slices shown, 19 images]
[im 4/95  soft-tissue]
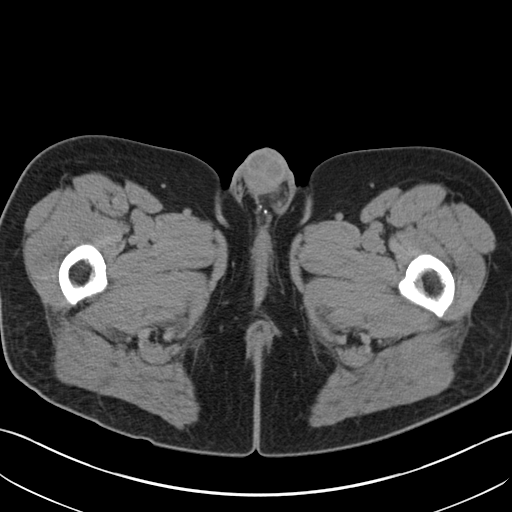
[im 4/95  bone]
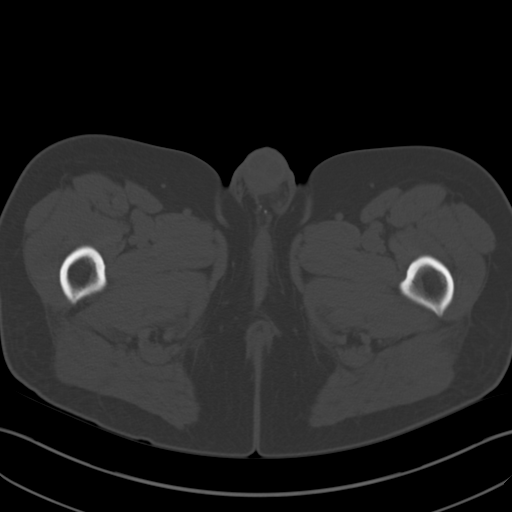
[im 12/95  soft-tissue]
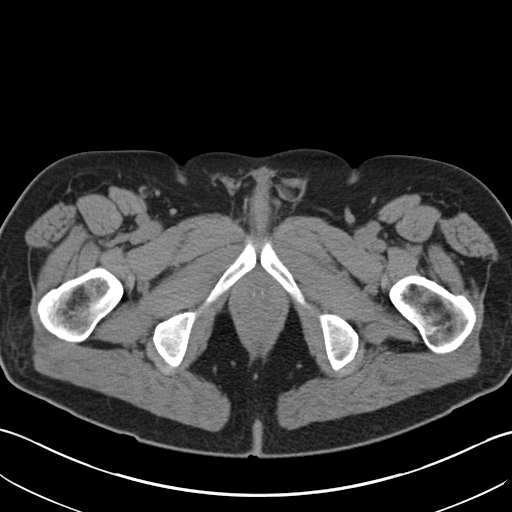
[im 20/95  soft-tissue]
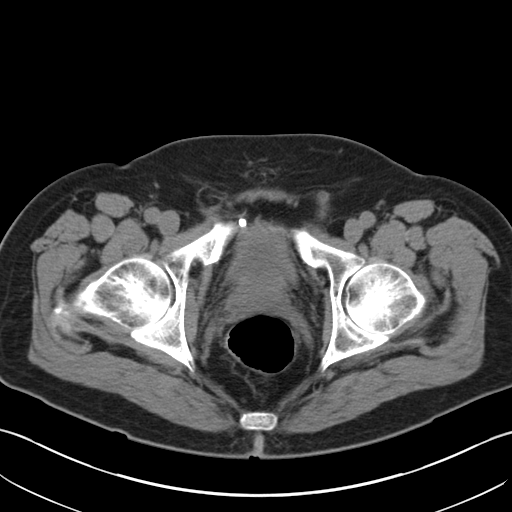
[im 28/95  soft-tissue]
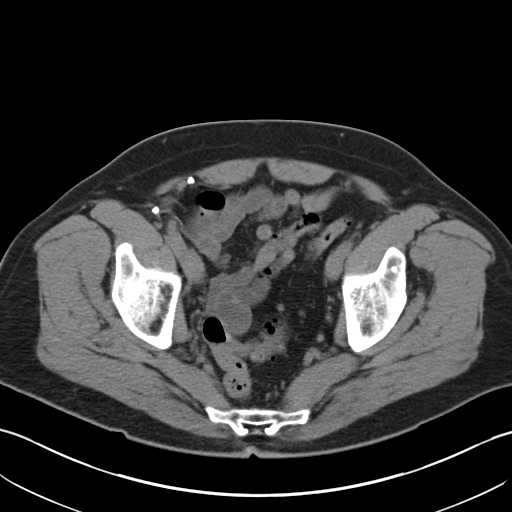
[im 32/95  soft-tissue]
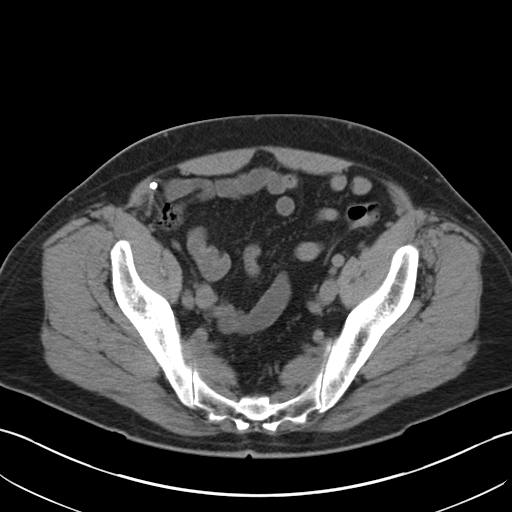
[im 40/95  soft-tissue]
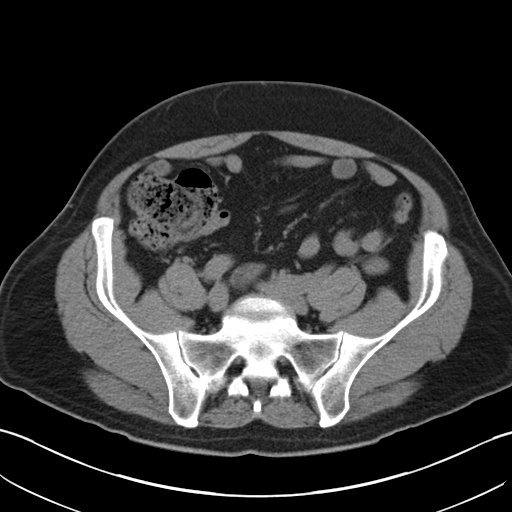
[im 48/95  soft-tissue]
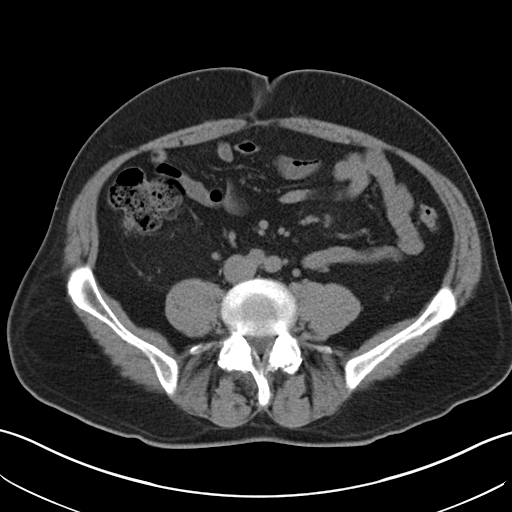
[im 55/95  soft-tissue]
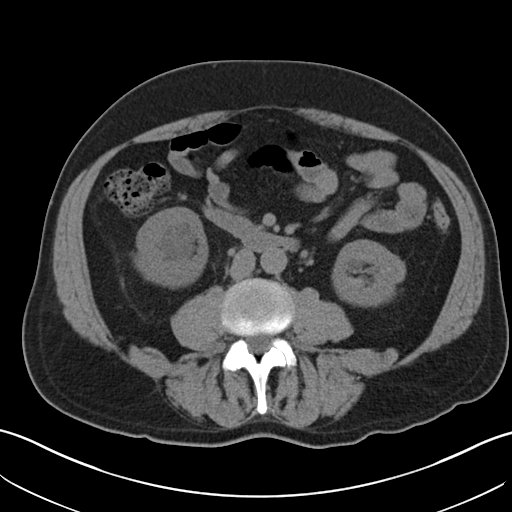
[im 63/95  soft-tissue]
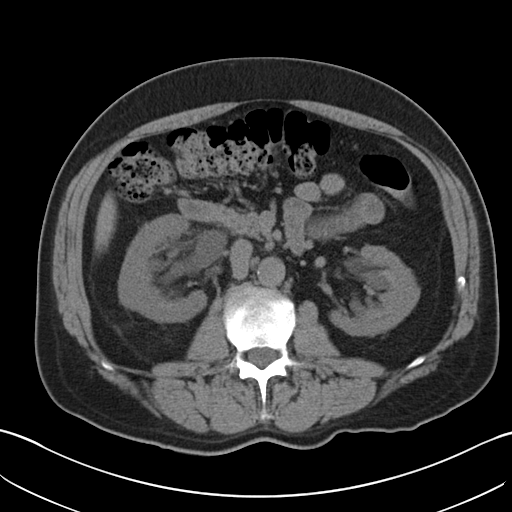
[im 63/95  bone]
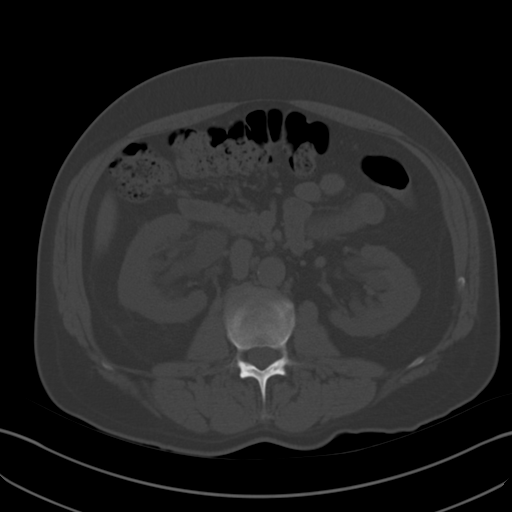
[im 67/95  soft-tissue]
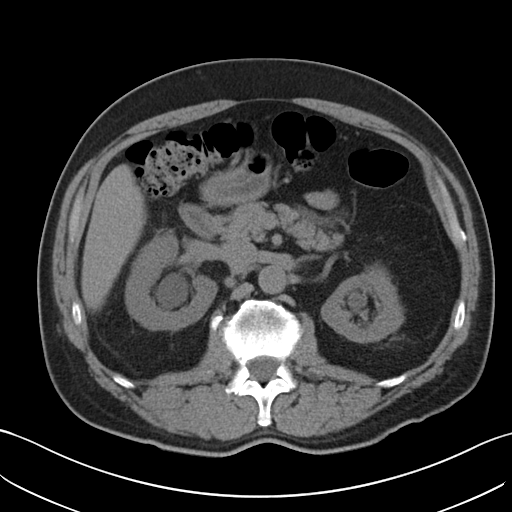
[im 75/95  soft-tissue]
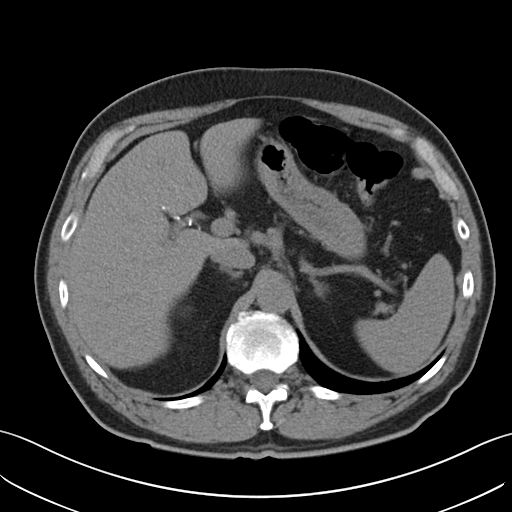
[im 79/95  lung]
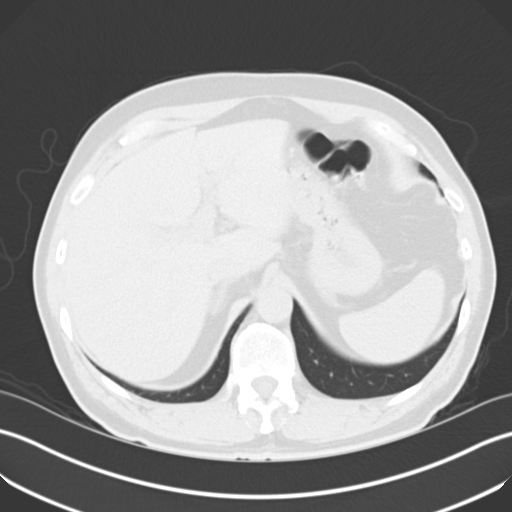
[im 83/95  soft-tissue]
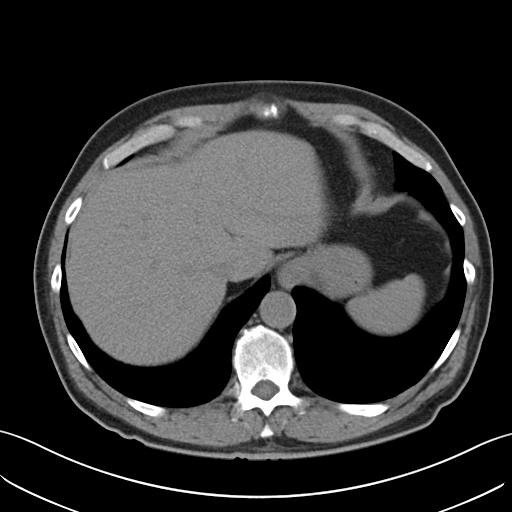
[im 83/95  lung]
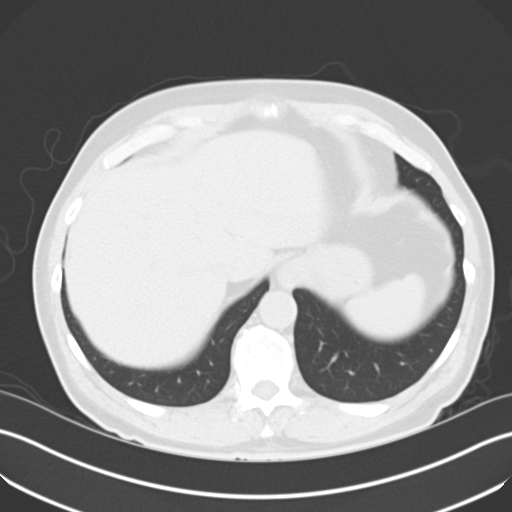
[im 87/95  lung]
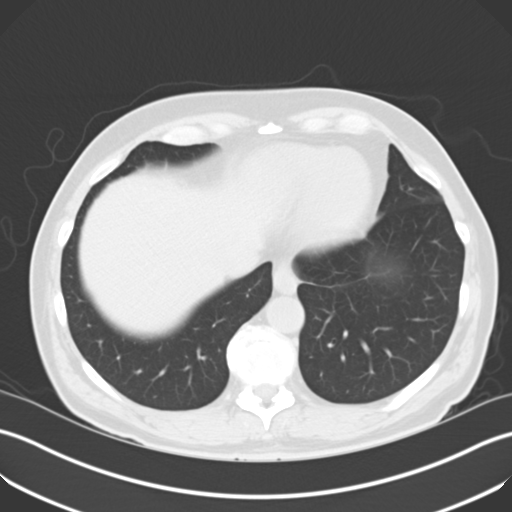
[im 91/95  soft-tissue]
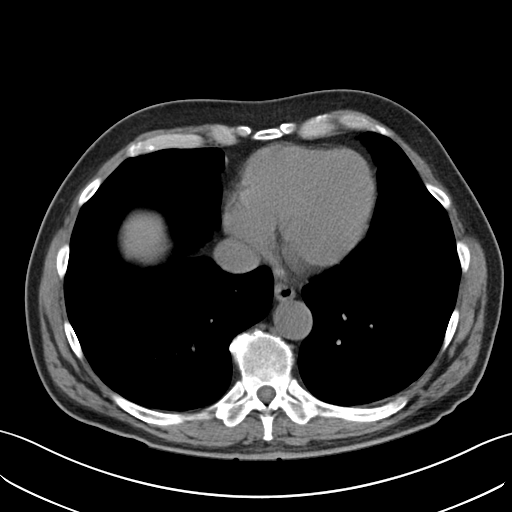
[im 91/95  lung]
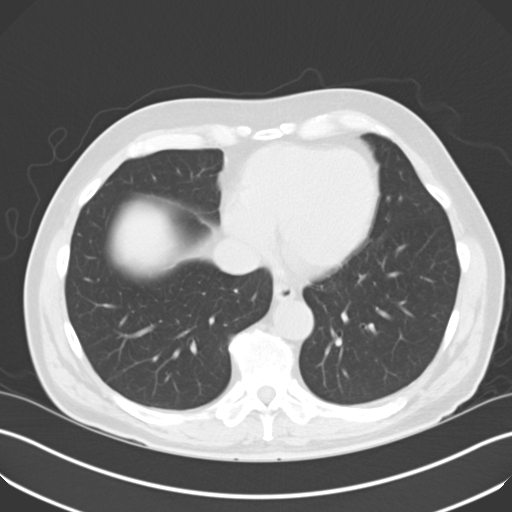

[15 of 32 positions shown; findings below may reference images not displayed]

FINDINGS: A 3 mm calculus at the right UPJ is noted with moderate
to severe right hydronephrosis.
No other urinary calculi are identified.
Bilateral renal cysts are present.

The liver, spleen, adrenal glands and pancreas are unremarkable.
The patient is status post cholecystectomy.

Please note that parenchymal abnormalities may be missed as
intravenous contrast was not administered.

No free fluid, enlarged lymph nodes, biliary dilation or abdominal
aortic aneurysm identified.

Colonic diverticulosis identified without evidence of
diverticulitis.
The appendix is normal.
The bladder is not well distended.
Prostate enlargement is present.

No acute or suspicious bony abnormalities are identified.
IMPRESSION: 3 mm right UPJ calculus with moderate to severe right
hydronephrosis.

## 2014-12-28 DIAGNOSIS — L853 Xerosis cutis: Secondary | ICD-10-CM | POA: Diagnosis not present

## 2014-12-28 DIAGNOSIS — L905 Scar conditions and fibrosis of skin: Secondary | ICD-10-CM | POA: Diagnosis not present

## 2014-12-28 DIAGNOSIS — Z85828 Personal history of other malignant neoplasm of skin: Secondary | ICD-10-CM | POA: Diagnosis not present

## 2014-12-28 DIAGNOSIS — L570 Actinic keratosis: Secondary | ICD-10-CM | POA: Diagnosis not present

## 2014-12-28 DIAGNOSIS — D1801 Hemangioma of skin and subcutaneous tissue: Secondary | ICD-10-CM | POA: Diagnosis not present

## 2014-12-28 DIAGNOSIS — L821 Other seborrheic keratosis: Secondary | ICD-10-CM | POA: Diagnosis not present

## 2015-01-29 DIAGNOSIS — H25013 Cortical age-related cataract, bilateral: Secondary | ICD-10-CM | POA: Diagnosis not present

## 2015-01-29 DIAGNOSIS — H2513 Age-related nuclear cataract, bilateral: Secondary | ICD-10-CM | POA: Diagnosis not present

## 2015-01-29 DIAGNOSIS — H4011X1 Primary open-angle glaucoma, mild stage: Secondary | ICD-10-CM | POA: Diagnosis not present

## 2015-01-29 DIAGNOSIS — H4011X3 Primary open-angle glaucoma, severe stage: Secondary | ICD-10-CM | POA: Diagnosis not present

## 2015-04-11 DIAGNOSIS — N200 Calculus of kidney: Secondary | ICD-10-CM | POA: Diagnosis not present

## 2015-04-11 DIAGNOSIS — N281 Cyst of kidney, acquired: Secondary | ICD-10-CM | POA: Diagnosis not present

## 2015-05-23 ENCOUNTER — Ambulatory Visit (INDEPENDENT_AMBULATORY_CARE_PROVIDER_SITE_OTHER): Payer: Medicare Other | Admitting: Ophthalmology

## 2015-05-23 DIAGNOSIS — H2513 Age-related nuclear cataract, bilateral: Secondary | ICD-10-CM

## 2015-05-23 DIAGNOSIS — D3132 Benign neoplasm of left choroid: Secondary | ICD-10-CM

## 2015-05-23 DIAGNOSIS — H4011X2 Primary open-angle glaucoma, moderate stage: Secondary | ICD-10-CM | POA: Diagnosis not present

## 2015-05-23 DIAGNOSIS — H43813 Vitreous degeneration, bilateral: Secondary | ICD-10-CM | POA: Diagnosis not present

## 2015-06-27 DIAGNOSIS — Z125 Encounter for screening for malignant neoplasm of prostate: Secondary | ICD-10-CM | POA: Diagnosis not present

## 2015-06-27 DIAGNOSIS — Z0000 Encounter for general adult medical examination without abnormal findings: Secondary | ICD-10-CM | POA: Diagnosis not present

## 2015-06-27 DIAGNOSIS — E780 Pure hypercholesterolemia: Secondary | ICD-10-CM | POA: Diagnosis not present

## 2015-07-03 DIAGNOSIS — J309 Allergic rhinitis, unspecified: Secondary | ICD-10-CM | POA: Diagnosis not present

## 2015-07-03 DIAGNOSIS — Z0000 Encounter for general adult medical examination without abnormal findings: Secondary | ICD-10-CM | POA: Diagnosis not present

## 2015-07-03 DIAGNOSIS — Z23 Encounter for immunization: Secondary | ICD-10-CM | POA: Diagnosis not present

## 2015-07-03 DIAGNOSIS — B370 Candidal stomatitis: Secondary | ICD-10-CM | POA: Diagnosis not present

## 2015-07-03 DIAGNOSIS — E780 Pure hypercholesterolemia: Secondary | ICD-10-CM | POA: Diagnosis not present

## 2015-07-06 DIAGNOSIS — L821 Other seborrheic keratosis: Secondary | ICD-10-CM | POA: Diagnosis not present

## 2015-07-06 DIAGNOSIS — L570 Actinic keratosis: Secondary | ICD-10-CM | POA: Diagnosis not present

## 2015-07-06 DIAGNOSIS — Z85828 Personal history of other malignant neoplasm of skin: Secondary | ICD-10-CM | POA: Diagnosis not present

## 2015-07-06 DIAGNOSIS — L853 Xerosis cutis: Secondary | ICD-10-CM | POA: Diagnosis not present

## 2015-07-06 DIAGNOSIS — L918 Other hypertrophic disorders of the skin: Secondary | ICD-10-CM | POA: Diagnosis not present

## 2015-07-06 DIAGNOSIS — D1801 Hemangioma of skin and subcutaneous tissue: Secondary | ICD-10-CM | POA: Diagnosis not present

## 2015-08-01 DIAGNOSIS — H4011X3 Primary open-angle glaucoma, severe stage: Secondary | ICD-10-CM | POA: Diagnosis not present

## 2015-08-01 DIAGNOSIS — H21561 Pupillary abnormality, right eye: Secondary | ICD-10-CM | POA: Diagnosis not present

## 2015-08-01 DIAGNOSIS — H4011X1 Primary open-angle glaucoma, mild stage: Secondary | ICD-10-CM | POA: Diagnosis not present

## 2015-08-01 DIAGNOSIS — H02403 Unspecified ptosis of bilateral eyelids: Secondary | ICD-10-CM | POA: Diagnosis not present

## 2015-09-27 DIAGNOSIS — J387 Other diseases of larynx: Secondary | ICD-10-CM | POA: Diagnosis not present

## 2015-11-15 DIAGNOSIS — H401113 Primary open-angle glaucoma, right eye, severe stage: Secondary | ICD-10-CM | POA: Diagnosis not present

## 2015-11-15 DIAGNOSIS — H02403 Unspecified ptosis of bilateral eyelids: Secondary | ICD-10-CM | POA: Diagnosis not present

## 2015-11-15 DIAGNOSIS — H04123 Dry eye syndrome of bilateral lacrimal glands: Secondary | ICD-10-CM | POA: Diagnosis not present

## 2015-11-15 DIAGNOSIS — H401121 Primary open-angle glaucoma, left eye, mild stage: Secondary | ICD-10-CM | POA: Diagnosis not present

## 2015-12-03 DIAGNOSIS — H401121 Primary open-angle glaucoma, left eye, mild stage: Secondary | ICD-10-CM | POA: Diagnosis not present

## 2016-01-04 DIAGNOSIS — D485 Neoplasm of uncertain behavior of skin: Secondary | ICD-10-CM | POA: Diagnosis not present

## 2016-01-04 DIAGNOSIS — Z85828 Personal history of other malignant neoplasm of skin: Secondary | ICD-10-CM | POA: Diagnosis not present

## 2016-01-04 DIAGNOSIS — L821 Other seborrheic keratosis: Secondary | ICD-10-CM | POA: Diagnosis not present

## 2016-01-04 DIAGNOSIS — C44229 Squamous cell carcinoma of skin of left ear and external auricular canal: Secondary | ICD-10-CM | POA: Diagnosis not present

## 2016-01-04 DIAGNOSIS — L570 Actinic keratosis: Secondary | ICD-10-CM | POA: Diagnosis not present

## 2016-01-04 DIAGNOSIS — D225 Melanocytic nevi of trunk: Secondary | ICD-10-CM | POA: Diagnosis not present

## 2016-01-04 DIAGNOSIS — D1801 Hemangioma of skin and subcutaneous tissue: Secondary | ICD-10-CM | POA: Diagnosis not present

## 2016-04-02 DIAGNOSIS — H25013 Cortical age-related cataract, bilateral: Secondary | ICD-10-CM | POA: Diagnosis not present

## 2016-04-02 DIAGNOSIS — D3132 Benign neoplasm of left choroid: Secondary | ICD-10-CM | POA: Diagnosis not present

## 2016-04-02 DIAGNOSIS — H2513 Age-related nuclear cataract, bilateral: Secondary | ICD-10-CM | POA: Diagnosis not present

## 2016-04-02 DIAGNOSIS — H43813 Vitreous degeneration, bilateral: Secondary | ICD-10-CM | POA: Diagnosis not present

## 2016-04-02 DIAGNOSIS — H401121 Primary open-angle glaucoma, left eye, mild stage: Secondary | ICD-10-CM | POA: Diagnosis not present

## 2016-04-02 DIAGNOSIS — H401113 Primary open-angle glaucoma, right eye, severe stage: Secondary | ICD-10-CM | POA: Diagnosis not present

## 2016-04-23 DIAGNOSIS — N281 Cyst of kidney, acquired: Secondary | ICD-10-CM | POA: Diagnosis not present

## 2016-05-22 ENCOUNTER — Ambulatory Visit (INDEPENDENT_AMBULATORY_CARE_PROVIDER_SITE_OTHER): Payer: Medicare Other | Admitting: Ophthalmology

## 2016-05-22 DIAGNOSIS — D3132 Benign neoplasm of left choroid: Secondary | ICD-10-CM

## 2016-05-22 DIAGNOSIS — H43813 Vitreous degeneration, bilateral: Secondary | ICD-10-CM | POA: Diagnosis not present

## 2016-05-22 DIAGNOSIS — H2513 Age-related nuclear cataract, bilateral: Secondary | ICD-10-CM | POA: Diagnosis not present

## 2016-07-07 DIAGNOSIS — Z125 Encounter for screening for malignant neoplasm of prostate: Secondary | ICD-10-CM | POA: Diagnosis not present

## 2016-07-07 DIAGNOSIS — Z0000 Encounter for general adult medical examination without abnormal findings: Secondary | ICD-10-CM | POA: Diagnosis not present

## 2016-07-07 DIAGNOSIS — E7800 Pure hypercholesterolemia, unspecified: Secondary | ICD-10-CM | POA: Diagnosis not present

## 2016-07-10 DIAGNOSIS — E7800 Pure hypercholesterolemia, unspecified: Secondary | ICD-10-CM | POA: Diagnosis not present

## 2016-07-10 DIAGNOSIS — R9431 Abnormal electrocardiogram [ECG] [EKG]: Secondary | ICD-10-CM | POA: Diagnosis not present

## 2016-07-10 DIAGNOSIS — K219 Gastro-esophageal reflux disease without esophagitis: Secondary | ICD-10-CM | POA: Diagnosis not present

## 2016-07-10 DIAGNOSIS — Z23 Encounter for immunization: Secondary | ICD-10-CM | POA: Diagnosis not present

## 2016-07-10 DIAGNOSIS — Z0000 Encounter for general adult medical examination without abnormal findings: Secondary | ICD-10-CM | POA: Diagnosis not present

## 2016-07-11 DIAGNOSIS — L570 Actinic keratosis: Secondary | ICD-10-CM | POA: Diagnosis not present

## 2016-07-11 DIAGNOSIS — D2339 Other benign neoplasm of skin of other parts of face: Secondary | ICD-10-CM | POA: Diagnosis not present

## 2016-07-11 DIAGNOSIS — L821 Other seborrheic keratosis: Secondary | ICD-10-CM | POA: Diagnosis not present

## 2016-07-11 DIAGNOSIS — D485 Neoplasm of uncertain behavior of skin: Secondary | ICD-10-CM | POA: Diagnosis not present

## 2016-07-11 DIAGNOSIS — D1801 Hemangioma of skin and subcutaneous tissue: Secondary | ICD-10-CM | POA: Diagnosis not present

## 2016-07-11 DIAGNOSIS — L738 Other specified follicular disorders: Secondary | ICD-10-CM | POA: Diagnosis not present

## 2016-07-11 DIAGNOSIS — Z85828 Personal history of other malignant neoplasm of skin: Secondary | ICD-10-CM | POA: Diagnosis not present

## 2016-07-17 DIAGNOSIS — E7800 Pure hypercholesterolemia, unspecified: Secondary | ICD-10-CM | POA: Diagnosis not present

## 2016-07-17 DIAGNOSIS — Z136 Encounter for screening for cardiovascular disorders: Secondary | ICD-10-CM | POA: Diagnosis not present

## 2016-07-25 DIAGNOSIS — R9431 Abnormal electrocardiogram [ECG] [EKG]: Secondary | ICD-10-CM | POA: Diagnosis not present

## 2016-08-06 DIAGNOSIS — H25013 Cortical age-related cataract, bilateral: Secondary | ICD-10-CM | POA: Diagnosis not present

## 2016-08-06 DIAGNOSIS — H401121 Primary open-angle glaucoma, left eye, mild stage: Secondary | ICD-10-CM | POA: Diagnosis not present

## 2016-08-06 DIAGNOSIS — H04123 Dry eye syndrome of bilateral lacrimal glands: Secondary | ICD-10-CM | POA: Diagnosis not present

## 2016-08-06 DIAGNOSIS — H02403 Unspecified ptosis of bilateral eyelids: Secondary | ICD-10-CM | POA: Diagnosis not present

## 2016-08-06 DIAGNOSIS — H2513 Age-related nuclear cataract, bilateral: Secondary | ICD-10-CM | POA: Diagnosis not present

## 2016-08-06 DIAGNOSIS — H401113 Primary open-angle glaucoma, right eye, severe stage: Secondary | ICD-10-CM | POA: Diagnosis not present

## 2016-11-11 DIAGNOSIS — J069 Acute upper respiratory infection, unspecified: Secondary | ICD-10-CM | POA: Diagnosis not present

## 2016-11-11 DIAGNOSIS — R05 Cough: Secondary | ICD-10-CM | POA: Diagnosis not present

## 2016-12-21 DIAGNOSIS — T1502XA Foreign body in cornea, left eye, initial encounter: Secondary | ICD-10-CM | POA: Diagnosis not present

## 2016-12-22 DIAGNOSIS — H18892 Other specified disorders of cornea, left eye: Secondary | ICD-10-CM | POA: Diagnosis not present

## 2016-12-22 DIAGNOSIS — H209 Unspecified iridocyclitis: Secondary | ICD-10-CM | POA: Diagnosis not present

## 2017-01-19 DIAGNOSIS — D1801 Hemangioma of skin and subcutaneous tissue: Secondary | ICD-10-CM | POA: Diagnosis not present

## 2017-01-19 DIAGNOSIS — D485 Neoplasm of uncertain behavior of skin: Secondary | ICD-10-CM | POA: Diagnosis not present

## 2017-01-19 DIAGNOSIS — L821 Other seborrheic keratosis: Secondary | ICD-10-CM | POA: Diagnosis not present

## 2017-01-19 DIAGNOSIS — D2262 Melanocytic nevi of left upper limb, including shoulder: Secondary | ICD-10-CM | POA: Diagnosis not present

## 2017-01-19 DIAGNOSIS — L570 Actinic keratosis: Secondary | ICD-10-CM | POA: Diagnosis not present

## 2017-01-19 DIAGNOSIS — C44319 Basal cell carcinoma of skin of other parts of face: Secondary | ICD-10-CM | POA: Diagnosis not present

## 2017-01-19 DIAGNOSIS — D225 Melanocytic nevi of trunk: Secondary | ICD-10-CM | POA: Diagnosis not present

## 2017-01-19 DIAGNOSIS — L738 Other specified follicular disorders: Secondary | ICD-10-CM | POA: Diagnosis not present

## 2017-01-19 DIAGNOSIS — Z85828 Personal history of other malignant neoplasm of skin: Secondary | ICD-10-CM | POA: Diagnosis not present

## 2017-04-08 DIAGNOSIS — H25013 Cortical age-related cataract, bilateral: Secondary | ICD-10-CM | POA: Diagnosis not present

## 2017-04-08 DIAGNOSIS — H401113 Primary open-angle glaucoma, right eye, severe stage: Secondary | ICD-10-CM | POA: Diagnosis not present

## 2017-04-08 DIAGNOSIS — H2513 Age-related nuclear cataract, bilateral: Secondary | ICD-10-CM | POA: Diagnosis not present

## 2017-04-08 DIAGNOSIS — H401121 Primary open-angle glaucoma, left eye, mild stage: Secondary | ICD-10-CM | POA: Diagnosis not present

## 2017-04-27 DIAGNOSIS — R351 Nocturia: Secondary | ICD-10-CM | POA: Diagnosis not present

## 2017-04-27 DIAGNOSIS — N401 Enlarged prostate with lower urinary tract symptoms: Secondary | ICD-10-CM | POA: Diagnosis not present

## 2017-04-27 DIAGNOSIS — N281 Cyst of kidney, acquired: Secondary | ICD-10-CM | POA: Diagnosis not present

## 2017-04-27 DIAGNOSIS — Z87442 Personal history of urinary calculi: Secondary | ICD-10-CM | POA: Diagnosis not present

## 2017-07-10 DIAGNOSIS — Z125 Encounter for screening for malignant neoplasm of prostate: Secondary | ICD-10-CM | POA: Diagnosis not present

## 2017-07-10 DIAGNOSIS — E7800 Pure hypercholesterolemia, unspecified: Secondary | ICD-10-CM | POA: Diagnosis not present

## 2017-07-10 DIAGNOSIS — Z23 Encounter for immunization: Secondary | ICD-10-CM | POA: Diagnosis not present

## 2017-07-10 DIAGNOSIS — E559 Vitamin D deficiency, unspecified: Secondary | ICD-10-CM | POA: Diagnosis not present

## 2017-07-10 DIAGNOSIS — Z0000 Encounter for general adult medical examination without abnormal findings: Secondary | ICD-10-CM | POA: Diagnosis not present

## 2017-07-16 DIAGNOSIS — N400 Enlarged prostate without lower urinary tract symptoms: Secondary | ICD-10-CM | POA: Diagnosis not present

## 2017-07-16 DIAGNOSIS — Z23 Encounter for immunization: Secondary | ICD-10-CM | POA: Diagnosis not present

## 2017-07-16 DIAGNOSIS — K219 Gastro-esophageal reflux disease without esophagitis: Secondary | ICD-10-CM | POA: Diagnosis not present

## 2017-07-16 DIAGNOSIS — E7800 Pure hypercholesterolemia, unspecified: Secondary | ICD-10-CM | POA: Diagnosis not present

## 2017-07-27 DIAGNOSIS — Z85828 Personal history of other malignant neoplasm of skin: Secondary | ICD-10-CM | POA: Diagnosis not present

## 2017-07-27 DIAGNOSIS — D1801 Hemangioma of skin and subcutaneous tissue: Secondary | ICD-10-CM | POA: Diagnosis not present

## 2017-07-27 DIAGNOSIS — L821 Other seborrheic keratosis: Secondary | ICD-10-CM | POA: Diagnosis not present

## 2017-07-27 DIAGNOSIS — L570 Actinic keratosis: Secondary | ICD-10-CM | POA: Diagnosis not present

## 2017-08-03 DIAGNOSIS — H04123 Dry eye syndrome of bilateral lacrimal glands: Secondary | ICD-10-CM | POA: Diagnosis not present

## 2017-08-03 DIAGNOSIS — H40033 Anatomical narrow angle, bilateral: Secondary | ICD-10-CM | POA: Diagnosis not present

## 2017-08-03 DIAGNOSIS — H401113 Primary open-angle glaucoma, right eye, severe stage: Secondary | ICD-10-CM | POA: Diagnosis not present

## 2017-08-03 DIAGNOSIS — H401121 Primary open-angle glaucoma, left eye, mild stage: Secondary | ICD-10-CM | POA: Diagnosis not present

## 2017-08-20 DIAGNOSIS — Z23 Encounter for immunization: Secondary | ICD-10-CM | POA: Diagnosis not present

## 2018-01-12 DIAGNOSIS — E7800 Pure hypercholesterolemia, unspecified: Secondary | ICD-10-CM | POA: Diagnosis not present

## 2018-01-25 DIAGNOSIS — D1801 Hemangioma of skin and subcutaneous tissue: Secondary | ICD-10-CM | POA: Diagnosis not present

## 2018-01-25 DIAGNOSIS — L821 Other seborrheic keratosis: Secondary | ICD-10-CM | POA: Diagnosis not present

## 2018-01-25 DIAGNOSIS — L820 Inflamed seborrheic keratosis: Secondary | ICD-10-CM | POA: Diagnosis not present

## 2018-01-25 DIAGNOSIS — D2239 Melanocytic nevi of other parts of face: Secondary | ICD-10-CM | POA: Diagnosis not present

## 2018-01-25 DIAGNOSIS — D225 Melanocytic nevi of trunk: Secondary | ICD-10-CM | POA: Diagnosis not present

## 2018-01-25 DIAGNOSIS — Z85828 Personal history of other malignant neoplasm of skin: Secondary | ICD-10-CM | POA: Diagnosis not present

## 2018-01-25 DIAGNOSIS — L570 Actinic keratosis: Secondary | ICD-10-CM | POA: Diagnosis not present

## 2018-04-12 DIAGNOSIS — D3132 Benign neoplasm of left choroid: Secondary | ICD-10-CM | POA: Diagnosis not present

## 2018-04-12 DIAGNOSIS — H401121 Primary open-angle glaucoma, left eye, mild stage: Secondary | ICD-10-CM | POA: Diagnosis not present

## 2018-04-12 DIAGNOSIS — H401113 Primary open-angle glaucoma, right eye, severe stage: Secondary | ICD-10-CM | POA: Diagnosis not present

## 2018-04-12 DIAGNOSIS — H2513 Age-related nuclear cataract, bilateral: Secondary | ICD-10-CM | POA: Diagnosis not present

## 2018-07-15 DIAGNOSIS — Z125 Encounter for screening for malignant neoplasm of prostate: Secondary | ICD-10-CM | POA: Diagnosis not present

## 2018-07-15 DIAGNOSIS — E559 Vitamin D deficiency, unspecified: Secondary | ICD-10-CM | POA: Diagnosis not present

## 2018-07-15 DIAGNOSIS — Z79899 Other long term (current) drug therapy: Secondary | ICD-10-CM | POA: Diagnosis not present

## 2018-07-15 DIAGNOSIS — E7800 Pure hypercholesterolemia, unspecified: Secondary | ICD-10-CM | POA: Diagnosis not present

## 2018-07-15 DIAGNOSIS — Z0000 Encounter for general adult medical examination without abnormal findings: Secondary | ICD-10-CM | POA: Diagnosis not present

## 2018-07-22 DIAGNOSIS — R7989 Other specified abnormal findings of blood chemistry: Secondary | ICD-10-CM | POA: Diagnosis not present

## 2018-07-22 DIAGNOSIS — J329 Chronic sinusitis, unspecified: Secondary | ICD-10-CM | POA: Diagnosis not present

## 2018-07-22 DIAGNOSIS — Z0000 Encounter for general adult medical examination without abnormal findings: Secondary | ICD-10-CM | POA: Diagnosis not present

## 2018-07-22 DIAGNOSIS — K219 Gastro-esophageal reflux disease without esophagitis: Secondary | ICD-10-CM | POA: Diagnosis not present

## 2018-07-22 DIAGNOSIS — E7800 Pure hypercholesterolemia, unspecified: Secondary | ICD-10-CM | POA: Diagnosis not present

## 2018-07-30 DIAGNOSIS — L821 Other seborrheic keratosis: Secondary | ICD-10-CM | POA: Diagnosis not present

## 2018-07-30 DIAGNOSIS — Z85828 Personal history of other malignant neoplasm of skin: Secondary | ICD-10-CM | POA: Diagnosis not present

## 2018-07-30 DIAGNOSIS — L570 Actinic keratosis: Secondary | ICD-10-CM | POA: Diagnosis not present

## 2018-07-30 DIAGNOSIS — D2272 Melanocytic nevi of left lower limb, including hip: Secondary | ICD-10-CM | POA: Diagnosis not present

## 2018-08-04 DIAGNOSIS — J342 Deviated nasal septum: Secondary | ICD-10-CM | POA: Diagnosis not present

## 2018-08-04 DIAGNOSIS — J343 Hypertrophy of nasal turbinates: Secondary | ICD-10-CM | POA: Diagnosis not present

## 2018-08-04 DIAGNOSIS — J310 Chronic rhinitis: Secondary | ICD-10-CM | POA: Diagnosis not present

## 2018-09-13 DIAGNOSIS — H401113 Primary open-angle glaucoma, right eye, severe stage: Secondary | ICD-10-CM | POA: Diagnosis not present

## 2018-09-13 DIAGNOSIS — H40033 Anatomical narrow angle, bilateral: Secondary | ICD-10-CM | POA: Diagnosis not present

## 2018-09-13 DIAGNOSIS — H04123 Dry eye syndrome of bilateral lacrimal glands: Secondary | ICD-10-CM | POA: Diagnosis not present

## 2018-09-13 DIAGNOSIS — H401121 Primary open-angle glaucoma, left eye, mild stage: Secondary | ICD-10-CM | POA: Diagnosis not present

## 2018-09-15 DIAGNOSIS — J310 Chronic rhinitis: Secondary | ICD-10-CM | POA: Diagnosis not present

## 2018-09-15 DIAGNOSIS — J342 Deviated nasal septum: Secondary | ICD-10-CM | POA: Diagnosis not present

## 2018-09-15 DIAGNOSIS — J343 Hypertrophy of nasal turbinates: Secondary | ICD-10-CM | POA: Diagnosis not present

## 2018-09-23 DIAGNOSIS — H401113 Primary open-angle glaucoma, right eye, severe stage: Secondary | ICD-10-CM | POA: Diagnosis not present

## 2018-10-11 DIAGNOSIS — H401121 Primary open-angle glaucoma, left eye, mild stage: Secondary | ICD-10-CM | POA: Diagnosis not present

## 2018-10-15 DIAGNOSIS — S0512XA Contusion of eyeball and orbital tissues, left eye, initial encounter: Secondary | ICD-10-CM | POA: Diagnosis not present

## 2018-12-10 DIAGNOSIS — H04123 Dry eye syndrome of bilateral lacrimal glands: Secondary | ICD-10-CM | POA: Diagnosis not present

## 2018-12-10 DIAGNOSIS — H401121 Primary open-angle glaucoma, left eye, mild stage: Secondary | ICD-10-CM | POA: Diagnosis not present

## 2018-12-10 DIAGNOSIS — H401113 Primary open-angle glaucoma, right eye, severe stage: Secondary | ICD-10-CM | POA: Diagnosis not present

## 2019-01-20 DIAGNOSIS — E559 Vitamin D deficiency, unspecified: Secondary | ICD-10-CM | POA: Diagnosis not present

## 2019-01-20 DIAGNOSIS — E7800 Pure hypercholesterolemia, unspecified: Secondary | ICD-10-CM | POA: Diagnosis not present

## 2019-01-20 DIAGNOSIS — R7989 Other specified abnormal findings of blood chemistry: Secondary | ICD-10-CM | POA: Diagnosis not present

## 2019-01-28 DIAGNOSIS — L821 Other seborrheic keratosis: Secondary | ICD-10-CM | POA: Diagnosis not present

## 2019-01-28 DIAGNOSIS — D1801 Hemangioma of skin and subcutaneous tissue: Secondary | ICD-10-CM | POA: Diagnosis not present

## 2019-01-28 DIAGNOSIS — L812 Freckles: Secondary | ICD-10-CM | POA: Diagnosis not present

## 2019-01-28 DIAGNOSIS — L570 Actinic keratosis: Secondary | ICD-10-CM | POA: Diagnosis not present

## 2019-01-28 DIAGNOSIS — D2262 Melanocytic nevi of left upper limb, including shoulder: Secondary | ICD-10-CM | POA: Diagnosis not present

## 2019-01-28 DIAGNOSIS — Z85828 Personal history of other malignant neoplasm of skin: Secondary | ICD-10-CM | POA: Diagnosis not present

## 2019-05-20 DIAGNOSIS — H40033 Anatomical narrow angle, bilateral: Secondary | ICD-10-CM | POA: Diagnosis not present

## 2019-05-20 DIAGNOSIS — H401113 Primary open-angle glaucoma, right eye, severe stage: Secondary | ICD-10-CM | POA: Diagnosis not present

## 2019-05-20 DIAGNOSIS — H401121 Primary open-angle glaucoma, left eye, mild stage: Secondary | ICD-10-CM | POA: Diagnosis not present

## 2019-05-20 DIAGNOSIS — H2513 Age-related nuclear cataract, bilateral: Secondary | ICD-10-CM | POA: Diagnosis not present

## 2019-06-13 ENCOUNTER — Other Ambulatory Visit: Payer: Self-pay

## 2019-06-29 DIAGNOSIS — H401121 Primary open-angle glaucoma, left eye, mild stage: Secondary | ICD-10-CM | POA: Diagnosis not present

## 2019-06-29 DIAGNOSIS — H40033 Anatomical narrow angle, bilateral: Secondary | ICD-10-CM | POA: Diagnosis not present

## 2019-06-29 DIAGNOSIS — H401113 Primary open-angle glaucoma, right eye, severe stage: Secondary | ICD-10-CM | POA: Diagnosis not present

## 2019-06-29 DIAGNOSIS — H2513 Age-related nuclear cataract, bilateral: Secondary | ICD-10-CM | POA: Diagnosis not present

## 2019-07-01 DIAGNOSIS — M25561 Pain in right knee: Secondary | ICD-10-CM | POA: Diagnosis not present

## 2019-07-21 DIAGNOSIS — Z125 Encounter for screening for malignant neoplasm of prostate: Secondary | ICD-10-CM | POA: Diagnosis not present

## 2019-07-21 DIAGNOSIS — E559 Vitamin D deficiency, unspecified: Secondary | ICD-10-CM | POA: Diagnosis not present

## 2019-07-21 DIAGNOSIS — Z0000 Encounter for general adult medical examination without abnormal findings: Secondary | ICD-10-CM | POA: Diagnosis not present

## 2019-07-21 DIAGNOSIS — E7800 Pure hypercholesterolemia, unspecified: Secondary | ICD-10-CM | POA: Diagnosis not present

## 2019-07-26 DIAGNOSIS — Z23 Encounter for immunization: Secondary | ICD-10-CM | POA: Diagnosis not present

## 2019-07-26 DIAGNOSIS — Z0000 Encounter for general adult medical examination without abnormal findings: Secondary | ICD-10-CM | POA: Diagnosis not present

## 2019-07-26 DIAGNOSIS — E7800 Pure hypercholesterolemia, unspecified: Secondary | ICD-10-CM | POA: Diagnosis not present

## 2019-07-26 DIAGNOSIS — K219 Gastro-esophageal reflux disease without esophagitis: Secondary | ICD-10-CM | POA: Diagnosis not present

## 2019-08-01 DIAGNOSIS — L812 Freckles: Secondary | ICD-10-CM | POA: Diagnosis not present

## 2019-08-01 DIAGNOSIS — L570 Actinic keratosis: Secondary | ICD-10-CM | POA: Diagnosis not present

## 2019-08-01 DIAGNOSIS — L821 Other seborrheic keratosis: Secondary | ICD-10-CM | POA: Diagnosis not present

## 2019-08-01 DIAGNOSIS — D1801 Hemangioma of skin and subcutaneous tissue: Secondary | ICD-10-CM | POA: Diagnosis not present

## 2019-08-01 DIAGNOSIS — C44319 Basal cell carcinoma of skin of other parts of face: Secondary | ICD-10-CM | POA: Diagnosis not present

## 2019-08-01 DIAGNOSIS — D485 Neoplasm of uncertain behavior of skin: Secondary | ICD-10-CM | POA: Diagnosis not present

## 2019-08-01 DIAGNOSIS — L820 Inflamed seborrheic keratosis: Secondary | ICD-10-CM | POA: Diagnosis not present

## 2019-08-01 DIAGNOSIS — D0439 Carcinoma in situ of skin of other parts of face: Secondary | ICD-10-CM | POA: Diagnosis not present

## 2019-08-01 DIAGNOSIS — Z85828 Personal history of other malignant neoplasm of skin: Secondary | ICD-10-CM | POA: Diagnosis not present

## 2019-08-22 DIAGNOSIS — M545 Low back pain, unspecified: Secondary | ICD-10-CM | POA: Insufficient documentation

## 2019-08-22 DIAGNOSIS — M47817 Spondylosis without myelopathy or radiculopathy, lumbosacral region: Secondary | ICD-10-CM | POA: Diagnosis not present

## 2019-09-15 DIAGNOSIS — H401121 Primary open-angle glaucoma, left eye, mild stage: Secondary | ICD-10-CM | POA: Diagnosis not present

## 2019-09-15 DIAGNOSIS — H401113 Primary open-angle glaucoma, right eye, severe stage: Secondary | ICD-10-CM | POA: Diagnosis not present

## 2019-09-15 DIAGNOSIS — H40033 Anatomical narrow angle, bilateral: Secondary | ICD-10-CM | POA: Diagnosis not present

## 2019-11-14 DIAGNOSIS — H401113 Primary open-angle glaucoma, right eye, severe stage: Secondary | ICD-10-CM | POA: Diagnosis not present

## 2019-11-28 DIAGNOSIS — H401121 Primary open-angle glaucoma, left eye, mild stage: Secondary | ICD-10-CM | POA: Diagnosis not present

## 2019-12-07 DIAGNOSIS — J329 Chronic sinusitis, unspecified: Secondary | ICD-10-CM | POA: Diagnosis not present

## 2019-12-07 DIAGNOSIS — R05 Cough: Secondary | ICD-10-CM | POA: Diagnosis not present

## 2019-12-07 DIAGNOSIS — K219 Gastro-esophageal reflux disease without esophagitis: Secondary | ICD-10-CM | POA: Diagnosis not present

## 2019-12-13 DIAGNOSIS — K317 Polyp of stomach and duodenum: Secondary | ICD-10-CM | POA: Diagnosis not present

## 2019-12-13 DIAGNOSIS — K219 Gastro-esophageal reflux disease without esophagitis: Secondary | ICD-10-CM | POA: Diagnosis not present

## 2019-12-13 DIAGNOSIS — D131 Benign neoplasm of stomach: Secondary | ICD-10-CM | POA: Diagnosis not present

## 2019-12-13 DIAGNOSIS — R05 Cough: Secondary | ICD-10-CM | POA: Diagnosis not present

## 2019-12-23 DIAGNOSIS — Z23 Encounter for immunization: Secondary | ICD-10-CM | POA: Diagnosis not present

## 2020-01-18 DIAGNOSIS — H401113 Primary open-angle glaucoma, right eye, severe stage: Secondary | ICD-10-CM | POA: Diagnosis not present

## 2020-01-18 DIAGNOSIS — H401121 Primary open-angle glaucoma, left eye, mild stage: Secondary | ICD-10-CM | POA: Diagnosis not present

## 2020-01-21 DIAGNOSIS — Z23 Encounter for immunization: Secondary | ICD-10-CM | POA: Diagnosis not present

## 2020-01-23 DIAGNOSIS — E7800 Pure hypercholesterolemia, unspecified: Secondary | ICD-10-CM | POA: Diagnosis not present

## 2020-01-30 DIAGNOSIS — D2262 Melanocytic nevi of left upper limb, including shoulder: Secondary | ICD-10-CM | POA: Diagnosis not present

## 2020-01-30 DIAGNOSIS — L812 Freckles: Secondary | ICD-10-CM | POA: Diagnosis not present

## 2020-01-30 DIAGNOSIS — D485 Neoplasm of uncertain behavior of skin: Secondary | ICD-10-CM | POA: Diagnosis not present

## 2020-01-30 DIAGNOSIS — Z85828 Personal history of other malignant neoplasm of skin: Secondary | ICD-10-CM | POA: Diagnosis not present

## 2020-01-30 DIAGNOSIS — L821 Other seborrheic keratosis: Secondary | ICD-10-CM | POA: Diagnosis not present

## 2020-01-30 DIAGNOSIS — D1801 Hemangioma of skin and subcutaneous tissue: Secondary | ICD-10-CM | POA: Diagnosis not present

## 2020-01-30 DIAGNOSIS — L918 Other hypertrophic disorders of the skin: Secondary | ICD-10-CM | POA: Diagnosis not present

## 2020-01-30 DIAGNOSIS — L570 Actinic keratosis: Secondary | ICD-10-CM | POA: Diagnosis not present

## 2020-03-01 DIAGNOSIS — L988 Other specified disorders of the skin and subcutaneous tissue: Secondary | ICD-10-CM | POA: Diagnosis not present

## 2020-03-01 DIAGNOSIS — Z85828 Personal history of other malignant neoplasm of skin: Secondary | ICD-10-CM | POA: Diagnosis not present

## 2020-03-01 DIAGNOSIS — D485 Neoplasm of uncertain behavior of skin: Secondary | ICD-10-CM | POA: Diagnosis not present

## 2020-03-01 DIAGNOSIS — L438 Other lichen planus: Secondary | ICD-10-CM | POA: Diagnosis not present

## 2020-03-01 DIAGNOSIS — L570 Actinic keratosis: Secondary | ICD-10-CM | POA: Diagnosis not present

## 2020-03-12 DIAGNOSIS — Z85828 Personal history of other malignant neoplasm of skin: Secondary | ICD-10-CM | POA: Diagnosis not present

## 2020-03-12 DIAGNOSIS — L570 Actinic keratosis: Secondary | ICD-10-CM | POA: Diagnosis not present

## 2020-07-30 DIAGNOSIS — R7989 Other specified abnormal findings of blood chemistry: Secondary | ICD-10-CM | POA: Diagnosis not present

## 2020-07-30 DIAGNOSIS — E7800 Pure hypercholesterolemia, unspecified: Secondary | ICD-10-CM | POA: Diagnosis not present

## 2020-07-30 DIAGNOSIS — Z0000 Encounter for general adult medical examination without abnormal findings: Secondary | ICD-10-CM | POA: Diagnosis not present

## 2020-07-30 DIAGNOSIS — E559 Vitamin D deficiency, unspecified: Secondary | ICD-10-CM | POA: Diagnosis not present

## 2020-07-30 DIAGNOSIS — Z125 Encounter for screening for malignant neoplasm of prostate: Secondary | ICD-10-CM | POA: Diagnosis not present

## 2020-08-01 DIAGNOSIS — Z85828 Personal history of other malignant neoplasm of skin: Secondary | ICD-10-CM | POA: Diagnosis not present

## 2020-08-01 DIAGNOSIS — L570 Actinic keratosis: Secondary | ICD-10-CM | POA: Diagnosis not present

## 2020-08-01 DIAGNOSIS — D1801 Hemangioma of skin and subcutaneous tissue: Secondary | ICD-10-CM | POA: Diagnosis not present

## 2020-08-01 DIAGNOSIS — L918 Other hypertrophic disorders of the skin: Secondary | ICD-10-CM | POA: Diagnosis not present

## 2020-08-01 DIAGNOSIS — L821 Other seborrheic keratosis: Secondary | ICD-10-CM | POA: Diagnosis not present

## 2020-08-02 DIAGNOSIS — Z0000 Encounter for general adult medical examination without abnormal findings: Secondary | ICD-10-CM | POA: Diagnosis not present

## 2020-08-02 DIAGNOSIS — E7800 Pure hypercholesterolemia, unspecified: Secondary | ICD-10-CM | POA: Diagnosis not present

## 2020-08-02 DIAGNOSIS — E559 Vitamin D deficiency, unspecified: Secondary | ICD-10-CM | POA: Diagnosis not present

## 2020-08-02 DIAGNOSIS — K219 Gastro-esophageal reflux disease without esophagitis: Secondary | ICD-10-CM | POA: Diagnosis not present

## 2020-08-02 DIAGNOSIS — Z125 Encounter for screening for malignant neoplasm of prostate: Secondary | ICD-10-CM | POA: Diagnosis not present

## 2020-08-02 DIAGNOSIS — R7301 Impaired fasting glucose: Secondary | ICD-10-CM | POA: Diagnosis not present

## 2020-08-02 DIAGNOSIS — Z23 Encounter for immunization: Secondary | ICD-10-CM | POA: Diagnosis not present

## 2020-10-08 DIAGNOSIS — H2513 Age-related nuclear cataract, bilateral: Secondary | ICD-10-CM | POA: Diagnosis not present

## 2020-10-08 DIAGNOSIS — H401113 Primary open-angle glaucoma, right eye, severe stage: Secondary | ICD-10-CM | POA: Diagnosis not present

## 2020-10-08 DIAGNOSIS — H2511 Age-related nuclear cataract, right eye: Secondary | ICD-10-CM | POA: Diagnosis not present

## 2020-10-08 DIAGNOSIS — H25013 Cortical age-related cataract, bilateral: Secondary | ICD-10-CM | POA: Diagnosis not present

## 2020-10-08 DIAGNOSIS — H401121 Primary open-angle glaucoma, left eye, mild stage: Secondary | ICD-10-CM | POA: Diagnosis not present

## 2020-10-23 DIAGNOSIS — H2511 Age-related nuclear cataract, right eye: Secondary | ICD-10-CM | POA: Diagnosis not present

## 2020-10-23 DIAGNOSIS — H25811 Combined forms of age-related cataract, right eye: Secondary | ICD-10-CM | POA: Diagnosis not present

## 2020-11-28 DIAGNOSIS — H2512 Age-related nuclear cataract, left eye: Secondary | ICD-10-CM | POA: Diagnosis not present

## 2020-11-28 DIAGNOSIS — H25012 Cortical age-related cataract, left eye: Secondary | ICD-10-CM | POA: Diagnosis not present

## 2020-12-04 DIAGNOSIS — H25012 Cortical age-related cataract, left eye: Secondary | ICD-10-CM | POA: Diagnosis not present

## 2020-12-04 DIAGNOSIS — H25812 Combined forms of age-related cataract, left eye: Secondary | ICD-10-CM | POA: Diagnosis not present

## 2020-12-04 DIAGNOSIS — H2512 Age-related nuclear cataract, left eye: Secondary | ICD-10-CM | POA: Diagnosis not present

## 2021-01-29 DIAGNOSIS — E7800 Pure hypercholesterolemia, unspecified: Secondary | ICD-10-CM | POA: Diagnosis not present

## 2021-01-29 DIAGNOSIS — M5432 Sciatica, left side: Secondary | ICD-10-CM | POA: Diagnosis not present

## 2021-01-30 DIAGNOSIS — D1801 Hemangioma of skin and subcutaneous tissue: Secondary | ICD-10-CM | POA: Diagnosis not present

## 2021-01-30 DIAGNOSIS — Z85828 Personal history of other malignant neoplasm of skin: Secondary | ICD-10-CM | POA: Diagnosis not present

## 2021-01-30 DIAGNOSIS — L820 Inflamed seborrheic keratosis: Secondary | ICD-10-CM | POA: Diagnosis not present

## 2021-01-30 DIAGNOSIS — L812 Freckles: Secondary | ICD-10-CM | POA: Diagnosis not present

## 2021-01-30 DIAGNOSIS — L570 Actinic keratosis: Secondary | ICD-10-CM | POA: Diagnosis not present

## 2021-01-30 DIAGNOSIS — L821 Other seborrheic keratosis: Secondary | ICD-10-CM | POA: Diagnosis not present

## 2021-03-04 DIAGNOSIS — M5416 Radiculopathy, lumbar region: Secondary | ICD-10-CM | POA: Diagnosis not present

## 2021-03-08 DIAGNOSIS — M5416 Radiculopathy, lumbar region: Secondary | ICD-10-CM | POA: Insufficient documentation

## 2021-03-19 DIAGNOSIS — M5416 Radiculopathy, lumbar region: Secondary | ICD-10-CM | POA: Diagnosis not present

## 2021-03-25 DIAGNOSIS — H401121 Primary open-angle glaucoma, left eye, mild stage: Secondary | ICD-10-CM | POA: Diagnosis not present

## 2021-03-25 DIAGNOSIS — H401113 Primary open-angle glaucoma, right eye, severe stage: Secondary | ICD-10-CM | POA: Diagnosis not present

## 2021-04-10 DIAGNOSIS — M5416 Radiculopathy, lumbar region: Secondary | ICD-10-CM | POA: Diagnosis not present

## 2021-04-10 DIAGNOSIS — M5459 Other low back pain: Secondary | ICD-10-CM | POA: Diagnosis not present

## 2021-05-16 ENCOUNTER — Ambulatory Visit
Admission: EM | Admit: 2021-05-16 | Discharge: 2021-05-16 | Disposition: A | Discharge status: Home / Self Care | Payer: BLUE CROSS/BLUE SHIELD | Attending: Physician Assistant | Admitting: Physician Assistant

## 2021-05-16 ENCOUNTER — Other Ambulatory Visit: Payer: Self-pay

## 2021-05-16 ENCOUNTER — Encounter: Payer: Self-pay | Admitting: Emergency Medicine

## 2021-05-16 DIAGNOSIS — J069 Acute upper respiratory infection, unspecified: Secondary | ICD-10-CM

## 2021-05-16 DIAGNOSIS — M545 Low back pain, unspecified: Secondary | ICD-10-CM

## 2021-05-16 DIAGNOSIS — R0981 Nasal congestion: Secondary | ICD-10-CM | POA: Diagnosis not present

## 2021-05-16 DIAGNOSIS — R059 Cough, unspecified: Secondary | ICD-10-CM

## 2021-05-16 MED ORDER — BENZONATATE 100 MG PO CAPS: 100.0000 mg | ORAL_CAPSULE | Freq: Three times a day (TID) | ORAL | 0 refills | Status: DC

## 2021-05-16 MED ORDER — TIZANIDINE HCL 2 MG PO CAPS: 2.0000 mg | ORAL_CAPSULE | Freq: Three times a day (TID) | ORAL | 0 refills | Status: DC | PRN

## 2021-05-16 NOTE — ED Triage Notes (Signed)
Low back pain, sinus problems. Tuesday picked up heavy cooler, turned, and fell to his knees in pain from back pain. Hx of back problems. Standing relieves pain, sitting and laying down aggravates symptoms. Denies numbness/tingling in legs/feet. Also requesting a covid test. States his sinus problems "flared up" starting Tuesday as well, states "has had sinus problems for years."

## 2021-05-16 NOTE — ED Provider Notes (Signed)
[facility]    CSN: [id number] Arrival date & time: 05/16/21  1639      History   Chief Complaint Chief Complaint  Patient presents with   Back Pain   Facial Pain    HPI Steve Harris. is a 75 y.o. male.   Patient presents today with a 2-day history of lower back pain.  Reports that he was lifting a cooler when he twisted and felt a sudden severe pain in his lower back.  Currently, pain is rated 7 on a 0-10 pain scale, localized to lower back without radiation, described as aching, worse with certain movements, no alleviating factors identified.  He does report a history of degenerative disc disease and has had several x-rays and MRIs.  He has been taking over-the-counter medications without improvement of symptoms.  He denies any bowel/bladder incontinence, lower extremity weakness, saddle anesthesia.  He has seen pain management and has not had intra-articular injections which provided some relief of symptoms but does not see specialist recently.  He denies previous spinal surgery.  He is having difficulty with daily activities as result of symptoms.  Denies any weakness, numbness, paresthesias.  In addition, patient reports a 2-day history of URI symptoms.  Reports cough, fever, nasal congestion, sinus pressure.  He denies any chest pain, shortness of breath, nausea, vomiting, body aches.  He has tried Sudafed at the recommendation of his pharmacist which provided no relief of symptoms.  He denies history of allergies, asthma, COPD.  He does not smoke.  He has had COVID-19 vaccinations.  He denies any known sick contacts.  Denies any recent antibiotic use.   Past Medical History:  Diagnosis Date   GERD (gastroesophageal reflux disease)    History of kidney stones    Hyperlipidemia    Kidney filling defect RIGHT    There are no problems to display for this patient.   Past Surgical History:  Procedure Laterality Date   CYSTOSCOPY WITH RETROGRADE PYELOGRAM,  URETEROSCOPY AND STENT PLACEMENT  10/21/2012   Procedure: CYSTOSCOPY WITH RETROGRADE PYELOGRAM, URETEROSCOPY AND STENT PLACEMENT;  Surgeon: Malka So, MD;  Location: [facility];  Service: Urology;  Laterality: Right;  no ureteroscopy/  bladder stone removal   CYSTOSCOPY WITH RETROGRADE PYELOGRAM, URETEROSCOPY AND STENT PLACEMENT  10/26/2012   Procedure: CYSTOSCOPY WITH RETROGRADE PYELOGRAM, URETEROSCOPY AND STENT PLACEMENT;  Surgeon: Malka So, MD;  Location: [facility];  Service: Urology;  Laterality: Right;   HEMORRHOID SURGERY     HEMORRHOID SURGERY  04-14-2003   LAPAROSCOPIC CHOLECYSTECTOMY  12-26-2003   AND LAPAROSCOPIC RIGHT INGUINAL HERNIA REPAIR W/ MESH       Home Medications    Prior to Admission medications   Medication Sig Start Date End Date Taking? Authorizing Provider  aspirin EC 81 MG tablet Take 81 mg by mouth every morning.   Yes [provider]  benzonatate (TESSALON) 100 MG capsule Take 1 capsule (100 mg total) by mouth every 8 (eight) hours. 05/16/21  Yes Dyasia Firestine K, PA-C  cholecalciferol (VITAMIN D) 1000 UNITS tablet Take 1,000 Units by mouth every morning.   Yes [provider]  dorzolamide (TRUSOPT) 2 % ophthalmic solution Place 1 drop into both eyes 2 (two) times daily.   Yes [provider]  latanoprost (XALATAN) 0.005 % ophthalmic solution Place 1 drop into both eyes at bedtime.   Yes [provider]  omeprazole (PRILOSEC) 20 MG capsule Take 20 mg by mouth every morning.  Yes [provider]  pravastatin (PRAVACHOL) 20 MG tablet Take 20 mg by mouth at bedtime.   Yes [provider]  tizanidine (ZANAFLEX) 2 MG capsule Take 1 capsule (2 mg total) by mouth 3 (three) times daily as needed for muscle spasms. 05/16/21  Yes Winston Misner, Derry Skill, PA-C    Family History History reviewed. No pertinent family history.  Social History Social History   Tobacco Use   Smoking status:  Never   Smokeless tobacco: Never  Substance Use Topics   Alcohol use: No   Drug use: No     Allergies   Patient has no known allergies.   Review of Systems Review of Systems  Constitutional:  Positive for activity change and fever. Negative for appetite change and fatigue.  HENT:  Positive for congestion and sinus pressure. Negative for sneezing and sore throat.   Respiratory:  Positive for cough. Negative for shortness of breath.   Cardiovascular:  Negative for chest pain.  Gastrointestinal:  Negative for abdominal pain, diarrhea, nausea and vomiting.  Musculoskeletal:  Positive for back pain. Negative for arthralgias and myalgias.  Neurological:  Negative for dizziness, light-headedness and headaches.    Physical Exam Triage Vital Signs ED Triage Vitals  Enc Vitals Group     BP 05/16/21 1732 130/81     Pulse Rate 05/16/21 1732 67     Resp 05/16/21 1732 14     Temp 05/16/21 1732 98.9 F (37.2 C)     Temp Source 05/16/21 1732 Oral     SpO2 05/16/21 1732 95 %     Weight --      Height --      Head Circumference --      Peak Flow --      Pain Score 05/16/21 1733 7     Pain Loc --      Pain Edu? --      Excl. in [place]? --    No data found.  Updated Vital Signs BP 130/81 (BP Location: Left Arm)   Pulse 67   Temp 98.9 F (37.2 C) (Oral)   Resp 14   SpO2 95%   Visual Acuity Right Eye Distance:   Left Eye Distance:   Bilateral Distance:    Right Eye Near:   Left Eye Near:    Bilateral Near:     Physical Exam Vitals reviewed.  Constitutional:      General: He is awake.     Appearance: Normal appearance. He is normal weight. He is not ill-appearing.     Comments: Very pleasant male appears stated age in no acute distress  HENT:     Head: Normocephalic and atraumatic.     Right Ear: Tympanic membrane, ear canal and external ear normal. Tympanic membrane is not erythematous or bulging.     Left Ear: Tympanic membrane, ear canal and external ear normal.  Tympanic membrane is not erythematous or bulging.     Nose: Nose normal.     Right Sinus: No maxillary sinus tenderness or frontal sinus tenderness.     Left Sinus: No maxillary sinus tenderness or frontal sinus tenderness.     Mouth/Throat:     Pharynx: Uvula midline. No oropharyngeal exudate or posterior oropharyngeal erythema.     Comments: Drainage present posterior oropharynx Cardiovascular:     Rate and Rhythm: Normal rate and regular rhythm.     Heart sounds: Normal heart sounds, S1 normal and S2 normal. No murmur heard. Pulmonary:  Effort: Pulmonary effort is normal. No accessory muscle usage or respiratory distress.     Breath sounds: Normal breath sounds. No stridor. No wheezing, rhonchi or rales.     Comments: Clear to auscultation bilaterally Musculoskeletal:     Cervical back: No tenderness or bony tenderness.     Thoracic back: No tenderness or bony tenderness.     Lumbar back: Tenderness present. No bony tenderness.     Comments: Back: No pain percussion of vertebrae.  Mild tenderness palpation of bilateral lumbar paraspinal muscles.  No deformity or step-off noted.  Lymphadenopathy:     Head:     Right side of head: No submental, submandibular or tonsillar adenopathy.     Left side of head: No submental, submandibular or tonsillar adenopathy.     Cervical: No cervical adenopathy.  Neurological:     Mental Status: He is alert.  Psychiatric:        Behavior: Behavior is cooperative.     UC Treatments / Results  Labs (all labs ordered are listed, but only abnormal results are displayed) Labs Reviewed  COVID-19, FLU A+B NAA    EKG   Radiology No results found.  Procedures Procedures (including critical care time)  Medications Ordered in UC Medications - No data to display  Initial Impression / Assessment and Plan / UC Course  I have reviewed the triage vital signs and the nursing notes.  Pertinent labs & imaging results that were available during my  care of the patient were reviewed by me and considered in my medical decision making (see chart for details).      X-ray of back deferred given no bony tenderness on exam and no traumatic injury.  Patient was prescribed Zanaflex 2 mg to be taken up to 3 times a day as needed.  He can use over-the-counter analgesics for additional pain relief.  Recommended he use heat, rest, stretch for symptom management.  Encouraged him to follow-up with pain management if symptoms do not improve.  Discussed alarm symptoms that warrant emergent evaluation.  Strict return precautions given to which patient expressed understanding.  No evidence of acute infection on physical exam.  Patient was tested for flu and COVID-19-results pending.  Discussed likely viral etiology given short duration of symptoms.  Discouraged him from using decongestants as this can elevate blood pressure and encouraged him to use over-the-counter medications including Mucinex, Flonase, antihistamines.  He was prescribed Tessalon for cough.  Discussed alarm symptoms that warrant emergent evaluation.  Strict return precautions given to which patient expressed understanding.  Final Clinical Impressions(s) / UC Diagnoses   Final diagnoses:  Acute bilateral low back pain without sciatica  Upper respiratory tract infection, unspecified type  Nasal congestion  Cough     Discharge Instructions      Take tizanidine up to 3 times a day as needed for back pain.  This will make you sleepy so do not drive or drink alcohol while taking it.  Please use over-the-counter medication such as Tylenol for additional symptom relief.  Use heat, rest, stretch for additional symptom management.  If your symptoms do not improve please follow-up with your primary care provider or your pain management specialist.  If you have any sudden worsening of symptoms including leg weakness, numbness, going to the bathroom yourself without feeling it you need to go to the  emergency room.  We will contact you with your COVID-19/flu testing results if you are positive.  Use Tessalon for cough.  Stay away  from decongestants including Sudafed.  Use Mucinex and Flonase for symptom relief.  Rest and drink plenty of fluid.  If you have any worsening symptoms please return for reevaluation or if severe go to the emergency room.     ED Prescriptions     Medication Sig Dispense Auth. Provider   benzonatate (TESSALON) 100 MG capsule Take 1 capsule (100 mg total) by mouth every 8 (eight) hours. 21 capsule Simon Llamas K, PA-C   tizanidine (ZANAFLEX) 2 MG capsule Take 1 capsule (2 mg total) by mouth 3 (three) times daily as needed for muscle spasms. 21 capsule Afsa Meany K, PA-C      PDMP not reviewed this encounter.   Terrilee Croak, PA-C 05/16/21 1752

## 2021-05-16 NOTE — Discharge Instructions (Addendum)
Take tizanidine up to 3 times a day as needed for back pain.  This will make you sleepy so do not drive or drink alcohol while taking it.  Please use over-the-counter medication such as Tylenol for additional symptom relief.  Use heat, rest, stretch for additional symptom management.  If your symptoms do not improve please follow-up with your primary care provider or your pain management specialist.  If you have any sudden worsening of symptoms including leg weakness, numbness, going to the bathroom yourself without feeling it you need to go to the emergency room.  We will contact you with your COVID-19/flu testing results if you are positive.  Use Tessalon for cough.  Stay away from decongestants including Sudafed.  Use Mucinex and Flonase for symptom relief.  Rest and drink plenty of fluid.  If you have any worsening symptoms please return for reevaluation or if severe go to the emergency room.

## 2021-05-18 LAB — COVID-19, FLU A+B NAA
Influenza A, NAA: NOT DETECTED
Influenza B, NAA: NOT DETECTED
SARS-CoV-2, NAA: DETECTED — AB

## 2021-07-22 DIAGNOSIS — H401121 Primary open-angle glaucoma, left eye, mild stage: Secondary | ICD-10-CM | POA: Diagnosis not present

## 2021-07-22 DIAGNOSIS — H35033 Hypertensive retinopathy, bilateral: Secondary | ICD-10-CM | POA: Diagnosis not present

## 2021-07-22 DIAGNOSIS — H35013 Changes in retinal vascular appearance, bilateral: Secondary | ICD-10-CM | POA: Diagnosis not present

## 2021-07-22 DIAGNOSIS — H401113 Primary open-angle glaucoma, right eye, severe stage: Secondary | ICD-10-CM | POA: Diagnosis not present

## 2021-07-29 DIAGNOSIS — D485 Neoplasm of uncertain behavior of skin: Secondary | ICD-10-CM | POA: Diagnosis not present

## 2021-07-29 DIAGNOSIS — L814 Other melanin hyperpigmentation: Secondary | ICD-10-CM | POA: Diagnosis not present

## 2021-07-29 DIAGNOSIS — L918 Other hypertrophic disorders of the skin: Secondary | ICD-10-CM | POA: Diagnosis not present

## 2021-07-29 DIAGNOSIS — D1801 Hemangioma of skin and subcutaneous tissue: Secondary | ICD-10-CM | POA: Diagnosis not present

## 2021-07-29 DIAGNOSIS — L821 Other seborrheic keratosis: Secondary | ICD-10-CM | POA: Diagnosis not present

## 2021-07-29 DIAGNOSIS — Z85828 Personal history of other malignant neoplasm of skin: Secondary | ICD-10-CM | POA: Diagnosis not present

## 2021-07-29 DIAGNOSIS — L570 Actinic keratosis: Secondary | ICD-10-CM | POA: Diagnosis not present

## 2021-07-29 DIAGNOSIS — D0439 Carcinoma in situ of skin of other parts of face: Secondary | ICD-10-CM | POA: Diagnosis not present

## 2021-07-29 DIAGNOSIS — D225 Melanocytic nevi of trunk: Secondary | ICD-10-CM | POA: Diagnosis not present

## 2021-08-05 DIAGNOSIS — E7801 Familial hypercholesterolemia: Secondary | ICD-10-CM | POA: Diagnosis not present

## 2021-08-05 DIAGNOSIS — R7301 Impaired fasting glucose: Secondary | ICD-10-CM | POA: Diagnosis not present

## 2021-08-05 DIAGNOSIS — Z125 Encounter for screening for malignant neoplasm of prostate: Secondary | ICD-10-CM | POA: Diagnosis not present

## 2021-08-05 DIAGNOSIS — Z0000 Encounter for general adult medical examination without abnormal findings: Secondary | ICD-10-CM | POA: Diagnosis not present

## 2021-08-08 ENCOUNTER — Other Ambulatory Visit: Payer: Self-pay | Admitting: Internal Medicine

## 2021-08-08 DIAGNOSIS — E7800 Pure hypercholesterolemia, unspecified: Secondary | ICD-10-CM | POA: Diagnosis not present

## 2021-08-08 DIAGNOSIS — E559 Vitamin D deficiency, unspecified: Secondary | ICD-10-CM | POA: Diagnosis not present

## 2021-08-08 DIAGNOSIS — R739 Hyperglycemia, unspecified: Secondary | ICD-10-CM | POA: Diagnosis not present

## 2021-08-08 DIAGNOSIS — K219 Gastro-esophageal reflux disease without esophagitis: Secondary | ICD-10-CM | POA: Diagnosis not present

## 2021-08-08 DIAGNOSIS — Z23 Encounter for immunization: Secondary | ICD-10-CM | POA: Diagnosis not present

## 2021-08-08 DIAGNOSIS — Z0000 Encounter for general adult medical examination without abnormal findings: Secondary | ICD-10-CM | POA: Diagnosis not present

## 2021-08-20 ENCOUNTER — Encounter (HOSPITAL_COMMUNITY): Payer: Self-pay

## 2021-08-20 ENCOUNTER — Emergency Department (HOSPITAL_COMMUNITY)
Admission: EM | Admit: 2021-08-20 | Discharge: 2021-08-21 | Disposition: A | Discharge status: Home / Self Care | Payer: Medicare Other | Attending: Emergency Medicine | Admitting: Emergency Medicine

## 2021-08-20 ENCOUNTER — Other Ambulatory Visit: Payer: Self-pay

## 2021-08-20 DIAGNOSIS — Z5321 Procedure and treatment not carried out due to patient leaving prior to being seen by health care provider: Secondary | ICD-10-CM | POA: Insufficient documentation

## 2021-08-20 DIAGNOSIS — R1031 Right lower quadrant pain: Secondary | ICD-10-CM | POA: Diagnosis not present

## 2021-08-20 LAB — CBC
HCT: 48.4 % (ref 39.0–52.0)
Hemoglobin: 16.4 g/dL (ref 13.0–17.0)
MCH: 31.5 pg (ref 26.0–34.0)
MCHC: 33.9 g/dL (ref 30.0–36.0)
MCV: 92.9 fL (ref 80.0–100.0)
Platelets: 259 10*3/uL (ref 150–400)
RBC: 5.21 MIL/uL (ref 4.22–5.81)
RDW: 12.6 % (ref 11.5–15.5)
WBC: 9.4 10*3/uL (ref 4.0–10.5)
nRBC: 0 % (ref 0.0–0.2)

## 2021-08-20 LAB — COMPREHENSIVE METABOLIC PANEL
ALT: 23 U/L (ref 0–44)
AST: 23 U/L (ref 15–41)
Albumin: 4.7 g/dL (ref 3.5–5.0)
Alkaline Phosphatase: 76 U/L (ref 38–126)
Anion gap: 5 (ref 5–15)
BUN: 26 mg/dL — ABNORMAL HIGH (ref 8–23)
CO2: 28 mmol/L (ref 22–32)
Calcium: 9.7 mg/dL (ref 8.9–10.3)
Chloride: 106 mmol/L (ref 98–111)
Creatinine, Ser: 1.15 mg/dL (ref 0.61–1.24)
GFR, Estimated: >60 mL/min (ref >60)
Glucose, Bld: 98 mg/dL (ref 70–99)
Potassium: 4.3 mmol/L (ref 3.5–5.1)
Sodium: 139 mmol/L (ref 135–145)
Total Bilirubin: 0.9 mg/dL (ref 0.3–1.2)
Total Protein: 7.5 g/dL (ref 6.5–8.1)

## 2021-08-20 LAB — LIPASE, BLOOD: Lipase: 37 U/L (ref 11–51)

## 2021-08-20 NOTE — ED Notes (Signed)
Pt ambulatory in ED lobby. 

## 2021-08-20 NOTE — ED Triage Notes (Signed)
Patient c/o RLQ pain that at times radiates into the right lower back x 1 week.  Patient denies any urinary issues.

## 2021-08-20 NOTE — ED Provider Notes (Cosign Needed)
Emergency Medicine Provider Triage Evaluation Note  Steve Harris. , a 75 y.o. male  was evaluated in triage.  Pt complains of right lower quadrant abdominal pain that seems to have been ongoing for approximately 1 week.  He states that he has not had any surgeries on his abdomen.  Denies nausea vomiting urinary symptoms. He states that he does have a history of kidney stones but states that this pain feels different he describes it as achy and constant.  Denies any hematuria.  No dysuria frequency urgency.  No testicular pain he states.  Review of Systems  Positive: Abdominal pain Negative: Fever  Physical Exam  BP 117/81 (BP Location: Left Arm)   Pulse 77   Temp 98.1 F (36.7 C) (Oral)   Resp 18   Ht 5\' 10"  (1.778 m)   Wt 94.3 kg   SpO2 98%   BMI 29.84 kg/m  Gen:   Awake, no distress   Resp:  Normal effort  MSK:   Moves extremities without difficulty  Other:  Very mild tenderness with deep palpation of right lower quadrant.  No guarding or rebound.  Negative Rovsing.  Medical Decision Making  Medically screening exam initiated at 1:23 PM.  Appropriate orders placed.  [name] was informed that the remainder of the evaluation will be completed by another provider, this initial triage assessment does not replace that evaluation, and the importance of remaining in the ED until their evaluation is complete.  Patient is 75 year old male presented today with abdominal pain for 1 week.  Exam is relatively benign.  Very low suspicion for appendicitis however given his age and persistent abdominal pain will obtain CT imaging.  We will also obtain urine.   [name], [place] 08/20/21 1352

## 2021-08-21 ENCOUNTER — Encounter (HOSPITAL_BASED_OUTPATIENT_CLINIC_OR_DEPARTMENT_OTHER): Payer: Self-pay | Admitting: Emergency Medicine

## 2021-08-21 ENCOUNTER — Emergency Department (HOSPITAL_BASED_OUTPATIENT_CLINIC_OR_DEPARTMENT_OTHER)
Admission: EM | Admit: 2021-08-21 | Discharge: 2021-08-21 | Disposition: A | Discharge status: Home / Self Care | Payer: Medicare Other | Source: Home / Self Care | Attending: Emergency Medicine | Admitting: Emergency Medicine

## 2021-08-21 ENCOUNTER — Emergency Department (HOSPITAL_BASED_OUTPATIENT_CLINIC_OR_DEPARTMENT_OTHER): Payer: Medicare Other

## 2021-08-21 ENCOUNTER — Other Ambulatory Visit: Payer: Self-pay

## 2021-08-21 DIAGNOSIS — K219 Gastro-esophageal reflux disease without esophagitis: Secondary | ICD-10-CM | POA: Insufficient documentation

## 2021-08-21 DIAGNOSIS — R109 Unspecified abdominal pain: Secondary | ICD-10-CM | POA: Diagnosis not present

## 2021-08-21 DIAGNOSIS — R1031 Right lower quadrant pain: Secondary | ICD-10-CM

## 2021-08-21 DIAGNOSIS — Z7982 Long term (current) use of aspirin: Secondary | ICD-10-CM | POA: Insufficient documentation

## 2021-08-21 LAB — URINALYSIS, ROUTINE W REFLEX MICROSCOPIC
Bilirubin Urine: NEGATIVE
Glucose, UA: NEGATIVE mg/dL
Hgb urine dipstick: NEGATIVE
Ketones, ur: NEGATIVE mg/dL
Nitrite: NEGATIVE
Protein, ur: NEGATIVE mg/dL
Specific Gravity, Urine: 1.03 (ref 1.005–1.030)
pH: 5.5 (ref 5.0–8.0)

## 2021-08-21 MED ORDER — SODIUM CHLORIDE 0.9 % IV BOLUS
1000.0000 mL | Freq: Once | INTRAVENOUS | Status: AC
  Administered 2021-08-21: 1000 mL via INTRAVENOUS

## 2021-08-21 MED ORDER — TAMSULOSIN HCL 0.4 MG PO CAPS: 0.4000 mg | ORAL_CAPSULE | Freq: Every day | ORAL | 0 refills | Status: AC

## 2021-08-21 MED ORDER — CYCLOBENZAPRINE HCL 10 MG PO TABS: 10.0000 mg | ORAL_TABLET | Freq: Two times a day (BID) | ORAL | 0 refills | Status: AC | PRN

## 2021-08-21 MED ORDER — IOHEXOL 300 MG/ML  SOLN
100.0000 mL | Freq: Once | INTRAMUSCULAR | Status: AC | PRN
  Administered 2021-08-21: 100 mL via INTRAVENOUS

## 2021-08-21 NOTE — ED Triage Notes (Signed)
Pt from home c/o abdominal pain in LRQ for the past week. Pt states that it gets worse when he stretches or bends over. Denis rebound tenderness. Pt currently at a 3 out of 10 but reports 10 out of 10 when any pressure it put on area. Pt was at [facility] yesterday and had blood drawn but was not seen.

## 2021-08-21 NOTE — ED Notes (Signed)
Patient transported to CT 

## 2021-08-21 NOTE — ED Provider Notes (Signed)
[name] Provider Note   CSN: [id number] Arrival date & time: 08/21/21  5366     History Chief Complaint  Patient presents with   Abdominal Pain    Steve Harris. is a 75 y.o. male.  HPI     75yo male with history of hyperlipidemia, nephrolithiasis, cholecystectomy, inguinal hernia presents with concern for right sided abdominal pain.   Has had right lower quadrant pain for the last week.  Aching, constant pain. Worse with movements, bending.  Normal appetite, no nausea, no vomiting, no diarrhea, no constipation, no urinary symptoms, fever, cough, dyspnea, CP.     Past Medical History:  Diagnosis Date   GERD (gastroesophageal reflux disease)    History of kidney stones    Hyperlipidemia    Kidney filling defect RIGHT    There are no problems to display for this patient.   Past Surgical History:  Procedure Laterality Date   CYSTOSCOPY WITH RETROGRADE PYELOGRAM, URETEROSCOPY AND STENT PLACEMENT  10/21/2012   Procedure: CYSTOSCOPY WITH RETROGRADE PYELOGRAM, URETEROSCOPY AND STENT PLACEMENT;  Surgeon: Malka So, MD;  Location: [facility];  Service: Urology;  Laterality: Right;  no ureteroscopy/  bladder stone removal   CYSTOSCOPY WITH RETROGRADE PYELOGRAM, URETEROSCOPY AND STENT PLACEMENT  10/26/2012   Procedure: CYSTOSCOPY WITH RETROGRADE PYELOGRAM, URETEROSCOPY AND STENT PLACEMENT;  Surgeon: Malka So, MD;  Location: [facility];  Service: Urology;  Laterality: Right;   HEMORRHOID SURGERY     HEMORRHOID SURGERY  04/14/2003   high cholesterol     LAPAROSCOPIC CHOLECYSTECTOMY  12/26/2003   AND LAPAROSCOPIC RIGHT INGUINAL HERNIA REPAIR W/ MESH       History reviewed. No pertinent family history.  Social History   Tobacco Use   Smoking status: Never   Smokeless tobacco: Never  Vaping Use   Vaping Use: Never used  Substance Use Topics   Alcohol use: No   Drug use: No    Home  Medications Prior to Admission medications   Medication Sig Start Date End Date Taking? Authorizing Provider  aspirin EC 81 MG tablet Take 81 mg by mouth every morning.   Yes [provider]  cholecalciferol (VITAMIN D) 1000 UNITS tablet Take 2,000 Units by mouth every morning.   Yes [provider]  cyclobenzaprine (FLEXERIL) 10 MG tablet Take 1 tablet (10 mg total) by mouth 2 (two) times daily as needed for muscle spasms. 08/21/21  Yes Gareth Morgan, MD  dorzolamide (TRUSOPT) 2 % ophthalmic solution Place 1 drop into both eyes 2 (two) times daily.   Yes [provider]  fluticasone (FLONASE) 50 MCG/ACT nasal spray Place 1 spray into both nostrils 2 (two) times daily. 03/21/19  Yes [provider]  ipratropium (ATROVENT) 0.06 % nasal spray Place 1 spray into the nose daily.   Yes [provider]  latanoprost (XALATAN) 0.005 % ophthalmic solution Place 1 drop into both eyes at bedtime.   Yes [provider]  omeprazole (PRILOSEC) 20 MG capsule Take 20 mg by mouth every morning.   Yes [provider]  pravastatin (PRAVACHOL) 20 MG tablet Take 20 mg by mouth at bedtime.   Yes [provider]  tamsulosin (FLOMAX) 0.4 MG CAPS capsule Take 1 capsule (0.4 mg total) by mouth daily. 08/21/21 09/20/21 Yes Gareth Morgan, MD  diphenhydrAMINE (BENADRYL) 25 mg capsule Take 25 mg by mouth as needed.    [provider]    Allergies    Patient has  no known allergies.  Review of Systems   Review of Systems  Constitutional:  Negative for fever.  Respiratory:  Negative for cough and shortness of breath.   Cardiovascular:  Negative for chest pain.  Gastrointestinal:  Positive for abdominal pain. Negative for constipation, diarrhea, nausea and vomiting.  Genitourinary:  Negative for difficulty urinating and dysuria.  Musculoskeletal:  Negative for back pain and neck stiffness.  Skin:  Negative for rash.  Neurological:   Negative for syncope.   Physical Exam Updated Vital Signs BP 134/77 (BP Location: Left Arm)   Pulse (!) 50   Temp 97.9 F (36.6 C) (Oral)   Resp 18   Ht 5\' 10"  (1.778 m)   Wt 94.3 kg   SpO2 100%   BMI 29.84 kg/m   Physical Exam Vitals and nursing note reviewed.  Constitutional:      General: He is not in acute distress.    Appearance: He is well-developed. He is not diaphoretic.  HENT:     Head: Normocephalic and atraumatic.  Eyes:     Conjunctiva/sclera: Conjunctivae normal.  Cardiovascular:     Rate and Rhythm: Normal rate and regular rhythm.  Pulmonary:     Effort: Pulmonary effort is normal. No respiratory distress.  Abdominal:     General: There is no distension.     Palpations: Abdomen is soft.     Tenderness: There is abdominal tenderness in the right lower quadrant. There is no guarding.  Musculoskeletal:     Cervical back: Normal range of motion.  Skin:    General: Skin is warm and dry.  Neurological:     Mental Status: He is alert and oriented to person, place, and time.    ED Results / Procedures / Treatments   Labs (all labs ordered are listed, but only abnormal results are displayed) Labs Reviewed  URINALYSIS, ROUTINE W REFLEX MICROSCOPIC - Abnormal; Notable for the following components:      Result Value   Leukocytes,Ua TRACE (*)    Bacteria, UA RARE (*)    All other components within normal limits    EKG None  Radiology CT ABDOMEN PELVIS W CONTRAST  Result Date: 08/21/2021 CLINICAL DATA:  Right lower quadrant abdominal pain. Appendicitis suspected EXAM: CT ABDOMEN AND PELVIS WITH CONTRAST TECHNIQUE: Multidetector CT imaging of the abdomen and pelvis was performed using the standard protocol following bolus administration of intravenous contrast. CONTRAST:  181mL OMNIPAQUE IOHEXOL 300 MG/ML  SOLN COMPARISON:  12/25/2012 FINDINGS: Lower chest: Clear lung bases. Normal heart size without pericardial or pleural effusion. Hepatobiliary: Normal  liver. Cholecystectomy, without biliary ductal dilatation. Pancreas: Normal, without mass or ductal dilatation. Spleen: Normal in size, without focal abnormality. Adrenals/Urinary Tract: Normal adrenal glands. Left significantly greater than right renal sinus cysts. Interpolar right renal too small to characterize lesion. Interpolar left renal 4 mm collecting system calculus. punctate interpolar right renal collecting system calculus. No hydroureter or ureteric calculi. Normal urinary bladder. Stomach/Bowel: Proximal gastric underdistention. Extensive colonic diverticulosis. Normal terminal ileum. Normal appendix, including on 62/2. Normal small bowel. Vascular/Lymphatic: Aortic atherosclerosis. No abdominopelvic adenopathy. Reproductive: Mild prostatomegaly. Other: No significant free fluid. No free intraperitoneal air. Prior right inguinal hernia repair. Right paramidline small fat containing ventral abdominal wall hernia on 46/2. Musculoskeletal: Trace L4-5 anterolisthesis. Degenerate disc disease at L5-S1. IMPRESSION: 1. No acute process in the abdomen or pelvis. Normal appendix. 2. Bilateral nephrolithiasis, without obstructive uropathy. 3. Prostatomegaly. 4. Aortic Atherosclerosis (ICD10-I70.0). 5. Small fat containing ventral abdominal wall hernia. Electronically Signed  By: Abigail Miyamoto M.D.   On: 08/21/2021 11:59    Procedures Procedures   Medications Ordered in ED Medications  sodium chloride 0.9 % bolus 1,000 mL (0 mLs Intravenous Stopped 08/21/21 1232)  iohexol (OMNIPAQUE) 300 MG/ML solution 100 mL (100 mLs Intravenous Contrast Given 08/21/21 1119)    ED Course  I have reviewed the triage vital signs and the nursing notes.  Pertinent labs & imaging results that were available during my care of the patient were reviewed by me and considered in my medical decision making (see chart for details).    MDM Rules/Calculators/A&P                            75yo male with history of  hyperlipidemia, nephrolithiasis, cholecystectomy, inguinal hernia presents with concern for right sided abdominal pain.  No sign of UTI. Reviewed labs from yesterday, no sign of pancreatitis, hepatitis, clinically significant renal abnormalities or electrolyte abnormalities.  Given no vomiting, or diarrhea with normal appetite do not suspect significant change since yesterday's labs.  CT abdomen pelvis obtained shows no evidence of incarcerated hernia, obstructing nephroithiasis, appendicitis or diverticulitis. Small fat containing ventral hernia in different location, not etiology of pain.  Also has pain radiating from back with movement to front and has degenerative disc disease. Suspect likely musculoskeletal etiology of pain> Given muscle relaxant and recommend PCP follow up.  Also given rx for flomax given prostatomegaly and urinary frequency at night and recommend PCP/urology follow up. Patient discharged in stable condition with understanding of reasons to return.     Final Clinical Impression(s) / ED Diagnoses Final diagnoses:  Right lower quadrant abdominal pain    Rx / DC Orders ED Discharge Orders          Ordered    cyclobenzaprine (FLEXERIL) 10 MG tablet  2 times daily PRN        08/21/21 1222    tamsulosin (FLOMAX) 0.4 MG CAPS capsule  Daily        08/21/21 1222             Gareth Morgan, MD 08/21/21 2149

## 2021-08-21 NOTE — ED Provider Notes (Signed)
   75 year old male presents with concern for early chronic pain.  He had labs completed yesterday at [facility].  He has not had any nausea, vomiting, diarrhea or symptoms to make me suspect his renal function has significantly changed since yesterday.  Feel he can have CT with contrast without repeat of labs today.   Gareth Morgan, MD 08/21/21 1050

## 2021-08-29 ENCOUNTER — Ambulatory Visit
Admission: RE | Admit: 2021-08-29 | Discharge: 2021-08-29 | Disposition: A | Discharge status: Home / Self Care | Payer: No Typology Code available for payment source | Source: Ambulatory Visit | Attending: Internal Medicine | Admitting: Internal Medicine

## 2021-08-29 DIAGNOSIS — E7800 Pure hypercholesterolemia, unspecified: Secondary | ICD-10-CM

## 2022-01-27 DIAGNOSIS — L821 Other seborrheic keratosis: Secondary | ICD-10-CM | POA: Diagnosis not present

## 2022-01-27 DIAGNOSIS — L570 Actinic keratosis: Secondary | ICD-10-CM | POA: Diagnosis not present

## 2022-01-27 DIAGNOSIS — L812 Freckles: Secondary | ICD-10-CM | POA: Diagnosis not present

## 2022-01-27 DIAGNOSIS — D1801 Hemangioma of skin and subcutaneous tissue: Secondary | ICD-10-CM | POA: Diagnosis not present

## 2022-01-27 DIAGNOSIS — Z85828 Personal history of other malignant neoplasm of skin: Secondary | ICD-10-CM | POA: Diagnosis not present

## 2022-02-17 DIAGNOSIS — H401113 Primary open-angle glaucoma, right eye, severe stage: Secondary | ICD-10-CM | POA: Diagnosis not present

## 2022-02-17 DIAGNOSIS — H401121 Primary open-angle glaucoma, left eye, mild stage: Secondary | ICD-10-CM | POA: Diagnosis not present

## 2022-02-27 DIAGNOSIS — E7800 Pure hypercholesterolemia, unspecified: Secondary | ICD-10-CM | POA: Diagnosis not present

## 2022-02-27 DIAGNOSIS — R739 Hyperglycemia, unspecified: Secondary | ICD-10-CM | POA: Diagnosis not present

## 2022-03-06 DIAGNOSIS — E7800 Pure hypercholesterolemia, unspecified: Secondary | ICD-10-CM | POA: Diagnosis not present

## 2022-03-06 DIAGNOSIS — K219 Gastro-esophageal reflux disease without esophagitis: Secondary | ICD-10-CM | POA: Diagnosis not present

## 2022-03-06 DIAGNOSIS — R739 Hyperglycemia, unspecified: Secondary | ICD-10-CM | POA: Diagnosis not present

## 2022-03-06 DIAGNOSIS — E559 Vitamin D deficiency, unspecified: Secondary | ICD-10-CM | POA: Diagnosis not present

## 2022-07-31 DIAGNOSIS — L409 Psoriasis, unspecified: Secondary | ICD-10-CM | POA: Diagnosis not present

## 2022-07-31 DIAGNOSIS — D2262 Melanocytic nevi of left upper limb, including shoulder: Secondary | ICD-10-CM | POA: Diagnosis not present

## 2022-07-31 DIAGNOSIS — C44329 Squamous cell carcinoma of skin of other parts of face: Secondary | ICD-10-CM | POA: Diagnosis not present

## 2022-07-31 DIAGNOSIS — L570 Actinic keratosis: Secondary | ICD-10-CM | POA: Diagnosis not present

## 2022-07-31 DIAGNOSIS — D485 Neoplasm of uncertain behavior of skin: Secondary | ICD-10-CM | POA: Diagnosis not present

## 2022-07-31 DIAGNOSIS — D1801 Hemangioma of skin and subcutaneous tissue: Secondary | ICD-10-CM | POA: Diagnosis not present

## 2022-07-31 DIAGNOSIS — L812 Freckles: Secondary | ICD-10-CM | POA: Diagnosis not present

## 2022-07-31 DIAGNOSIS — L918 Other hypertrophic disorders of the skin: Secondary | ICD-10-CM | POA: Diagnosis not present

## 2022-07-31 DIAGNOSIS — L821 Other seborrheic keratosis: Secondary | ICD-10-CM | POA: Diagnosis not present

## 2022-07-31 DIAGNOSIS — Z85828 Personal history of other malignant neoplasm of skin: Secondary | ICD-10-CM | POA: Diagnosis not present

## 2022-08-07 DIAGNOSIS — Z0000 Encounter for general adult medical examination without abnormal findings: Secondary | ICD-10-CM | POA: Diagnosis not present

## 2022-08-07 DIAGNOSIS — Z125 Encounter for screening for malignant neoplasm of prostate: Secondary | ICD-10-CM | POA: Diagnosis not present

## 2022-08-07 DIAGNOSIS — E7800 Pure hypercholesterolemia, unspecified: Secondary | ICD-10-CM | POA: Diagnosis not present

## 2022-08-07 DIAGNOSIS — R739 Hyperglycemia, unspecified: Secondary | ICD-10-CM | POA: Diagnosis not present

## 2022-08-14 DIAGNOSIS — R7303 Prediabetes: Secondary | ICD-10-CM | POA: Diagnosis not present

## 2022-08-14 DIAGNOSIS — E7800 Pure hypercholesterolemia, unspecified: Secondary | ICD-10-CM | POA: Diagnosis not present

## 2022-08-14 DIAGNOSIS — E559 Vitamin D deficiency, unspecified: Secondary | ICD-10-CM | POA: Diagnosis not present

## 2022-08-14 DIAGNOSIS — I2510 Atherosclerotic heart disease of native coronary artery without angina pectoris: Secondary | ICD-10-CM | POA: Diagnosis not present

## 2022-08-14 DIAGNOSIS — K219 Gastro-esophageal reflux disease without esophagitis: Secondary | ICD-10-CM | POA: Diagnosis not present

## 2022-08-14 DIAGNOSIS — Z0000 Encounter for general adult medical examination without abnormal findings: Secondary | ICD-10-CM | POA: Diagnosis not present

## 2022-08-14 DIAGNOSIS — Z23 Encounter for immunization: Secondary | ICD-10-CM | POA: Diagnosis not present

## 2022-08-14 DIAGNOSIS — Z125 Encounter for screening for malignant neoplasm of prostate: Secondary | ICD-10-CM | POA: Diagnosis not present

## 2022-08-14 DIAGNOSIS — E538 Deficiency of other specified B group vitamins: Secondary | ICD-10-CM | POA: Diagnosis not present

## 2022-09-04 DIAGNOSIS — H401121 Primary open-angle glaucoma, left eye, mild stage: Secondary | ICD-10-CM | POA: Diagnosis not present

## 2022-09-04 DIAGNOSIS — H35033 Hypertensive retinopathy, bilateral: Secondary | ICD-10-CM | POA: Diagnosis not present

## 2022-09-04 DIAGNOSIS — H401113 Primary open-angle glaucoma, right eye, severe stage: Secondary | ICD-10-CM | POA: Diagnosis not present

## 2022-09-04 DIAGNOSIS — H35013 Changes in retinal vascular appearance, bilateral: Secondary | ICD-10-CM | POA: Diagnosis not present

## 2022-09-11 DIAGNOSIS — C44329 Squamous cell carcinoma of skin of other parts of face: Secondary | ICD-10-CM | POA: Diagnosis not present

## 2022-09-11 DIAGNOSIS — Z85828 Personal history of other malignant neoplasm of skin: Secondary | ICD-10-CM | POA: Diagnosis not present

## 2023-01-29 DIAGNOSIS — R0982 Postnasal drip: Secondary | ICD-10-CM | POA: Diagnosis not present

## 2023-01-29 DIAGNOSIS — R82998 Other abnormal findings in urine: Secondary | ICD-10-CM | POA: Diagnosis not present

## 2023-02-04 DIAGNOSIS — L820 Inflamed seborrheic keratosis: Secondary | ICD-10-CM | POA: Diagnosis not present

## 2023-02-04 DIAGNOSIS — L570 Actinic keratosis: Secondary | ICD-10-CM | POA: Diagnosis not present

## 2023-02-04 DIAGNOSIS — N400 Enlarged prostate without lower urinary tract symptoms: Secondary | ICD-10-CM | POA: Diagnosis not present

## 2023-02-04 DIAGNOSIS — Z85828 Personal history of other malignant neoplasm of skin: Secondary | ICD-10-CM | POA: Diagnosis not present

## 2023-02-04 DIAGNOSIS — L812 Freckles: Secondary | ICD-10-CM | POA: Diagnosis not present

## 2023-02-04 DIAGNOSIS — D1801 Hemangioma of skin and subcutaneous tissue: Secondary | ICD-10-CM | POA: Diagnosis not present

## 2023-02-04 DIAGNOSIS — L821 Other seborrheic keratosis: Secondary | ICD-10-CM | POA: Diagnosis not present

## 2023-02-04 DIAGNOSIS — R310 Gross hematuria: Secondary | ICD-10-CM | POA: Diagnosis not present

## 2023-02-04 DIAGNOSIS — N210 Calculus in bladder: Secondary | ICD-10-CM | POA: Diagnosis not present

## 2023-02-10 DIAGNOSIS — E559 Vitamin D deficiency, unspecified: Secondary | ICD-10-CM | POA: Diagnosis not present

## 2023-02-10 DIAGNOSIS — R7303 Prediabetes: Secondary | ICD-10-CM | POA: Diagnosis not present

## 2023-02-10 DIAGNOSIS — E538 Deficiency of other specified B group vitamins: Secondary | ICD-10-CM | POA: Diagnosis not present

## 2023-02-10 DIAGNOSIS — E7800 Pure hypercholesterolemia, unspecified: Secondary | ICD-10-CM | POA: Diagnosis not present

## 2023-02-10 DIAGNOSIS — R82998 Other abnormal findings in urine: Secondary | ICD-10-CM | POA: Diagnosis not present

## 2023-02-16 DIAGNOSIS — R7303 Prediabetes: Secondary | ICD-10-CM | POA: Diagnosis not present

## 2023-02-16 DIAGNOSIS — E7800 Pure hypercholesterolemia, unspecified: Secondary | ICD-10-CM | POA: Diagnosis not present

## 2023-02-16 DIAGNOSIS — K219 Gastro-esophageal reflux disease without esophagitis: Secondary | ICD-10-CM | POA: Diagnosis not present

## 2023-02-16 DIAGNOSIS — R7989 Other specified abnormal findings of blood chemistry: Secondary | ICD-10-CM | POA: Diagnosis not present

## 2023-02-16 DIAGNOSIS — I2510 Atherosclerotic heart disease of native coronary artery without angina pectoris: Secondary | ICD-10-CM | POA: Diagnosis not present

## 2023-02-23 DIAGNOSIS — R109 Unspecified abdominal pain: Secondary | ICD-10-CM | POA: Diagnosis not present

## 2023-02-23 DIAGNOSIS — I700 Atherosclerosis of aorta: Secondary | ICD-10-CM | POA: Diagnosis not present

## 2023-02-23 DIAGNOSIS — R310 Gross hematuria: Secondary | ICD-10-CM | POA: Diagnosis not present

## 2023-02-23 DIAGNOSIS — R319 Hematuria, unspecified: Secondary | ICD-10-CM | POA: Diagnosis not present

## 2023-02-25 DIAGNOSIS — N368 Other specified disorders of urethra: Secondary | ICD-10-CM | POA: Diagnosis not present

## 2023-02-25 DIAGNOSIS — N210 Calculus in bladder: Secondary | ICD-10-CM | POA: Diagnosis not present

## 2023-02-25 DIAGNOSIS — R310 Gross hematuria: Secondary | ICD-10-CM | POA: Diagnosis not present

## 2023-03-05 DIAGNOSIS — H401113 Primary open-angle glaucoma, right eye, severe stage: Secondary | ICD-10-CM | POA: Diagnosis not present

## 2023-03-05 DIAGNOSIS — H04123 Dry eye syndrome of bilateral lacrimal glands: Secondary | ICD-10-CM | POA: Diagnosis not present

## 2023-03-05 DIAGNOSIS — H401121 Primary open-angle glaucoma, left eye, mild stage: Secondary | ICD-10-CM | POA: Diagnosis not present

## 2023-03-27 DIAGNOSIS — N210 Calculus in bladder: Secondary | ICD-10-CM | POA: Diagnosis not present

## 2023-03-27 DIAGNOSIS — N400 Enlarged prostate without lower urinary tract symptoms: Secondary | ICD-10-CM | POA: Diagnosis not present

## 2023-03-27 DIAGNOSIS — R310 Gross hematuria: Secondary | ICD-10-CM | POA: Diagnosis not present

## 2023-06-03 IMAGING — CT CT ABD-PELV W/ CM
2 of 5 series · 16 of 46 positions shown, 18 images · IV contrast (APPLIED)
Comparison: 12/25/2012

CLINICAL DATA: Right lower quadrant abdominal pain. Appendicitis
suspected

EXAM:
CT ABDOMEN AND PELVIS WITH CONTRAST
TECHNIQUE: Multidetector CT imaging of the abdomen and pelvis was performed
using the standard protocol following bolus administration of
intravenous contrast.
CONTRAST:  100mL OMNIPAQUE IOHEXOL 300 MG/ML  SOLN

[Series 2: abd pel w · axial · 0.81mm/px · z∈[-212,+193]mm · 13 of 91 slices shown, 15 images]
[im 5/91  soft-tissue]
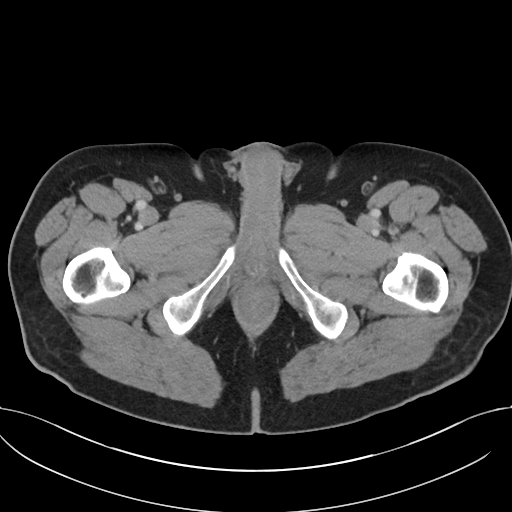
[im 5/91  bone]
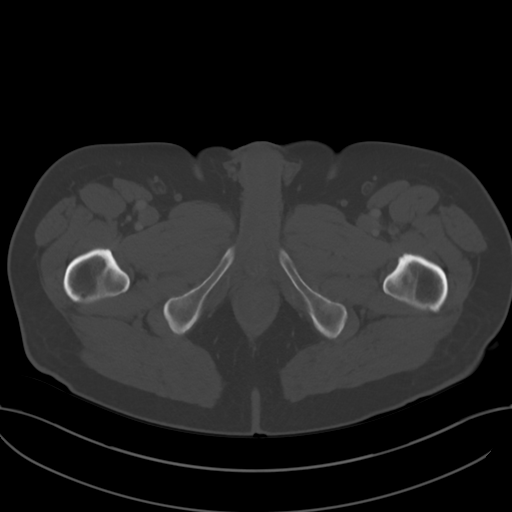
[im 15/91  soft-tissue]
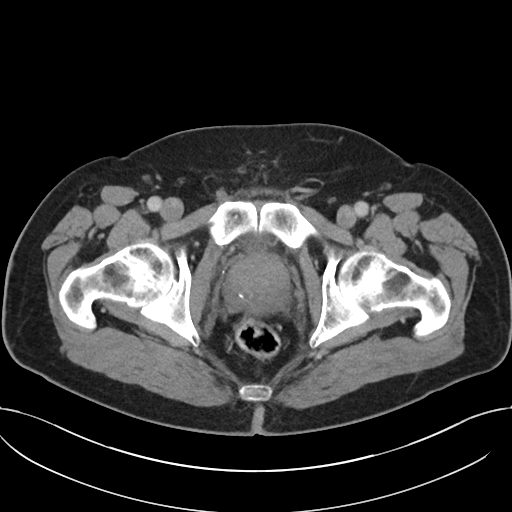
[im 19/91  soft-tissue]
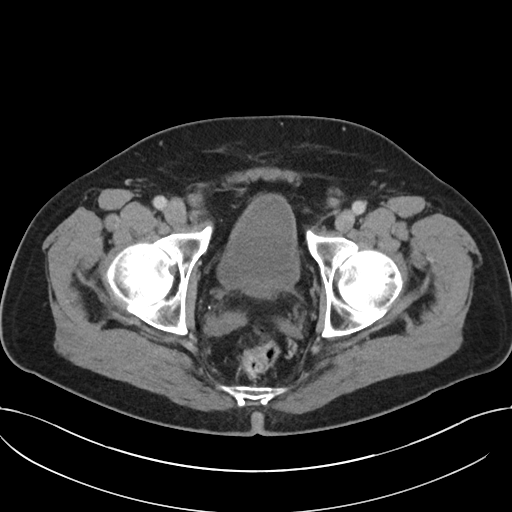
[im 24/91  soft-tissue]
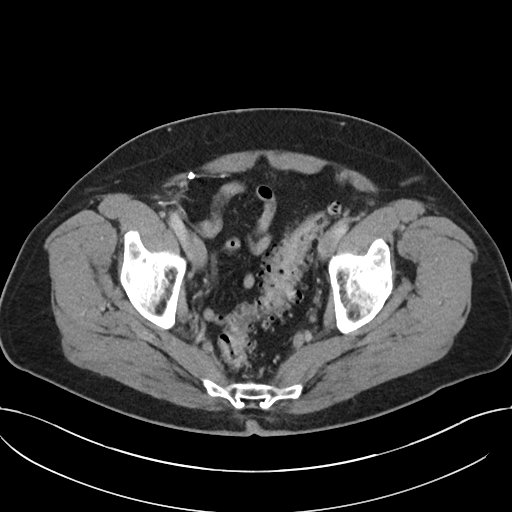
[im 34/91  soft-tissue]
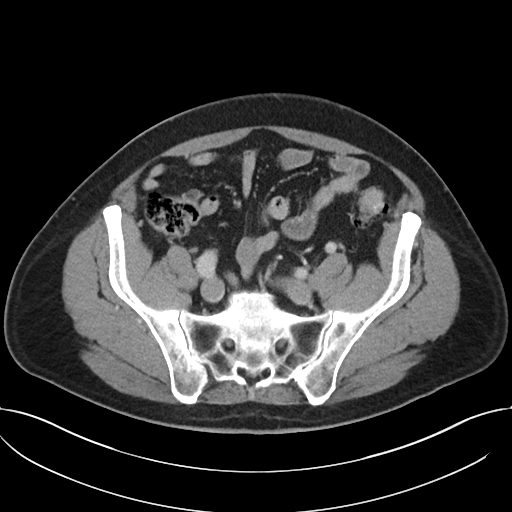
[im 38/91  soft-tissue]
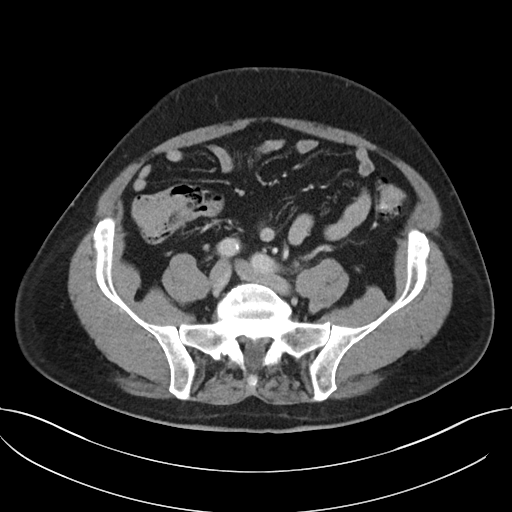
[im 48/91  soft-tissue]
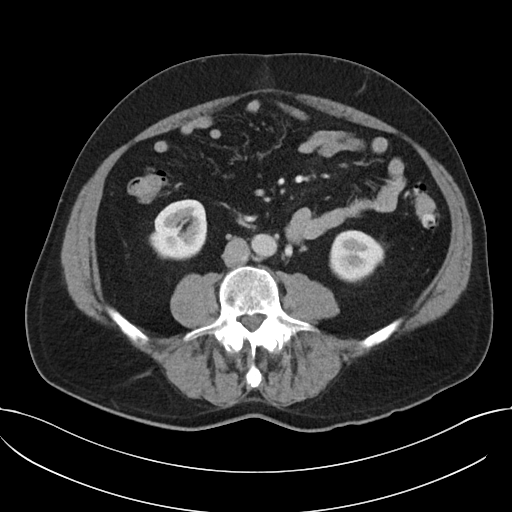
[im 53/91  soft-tissue]
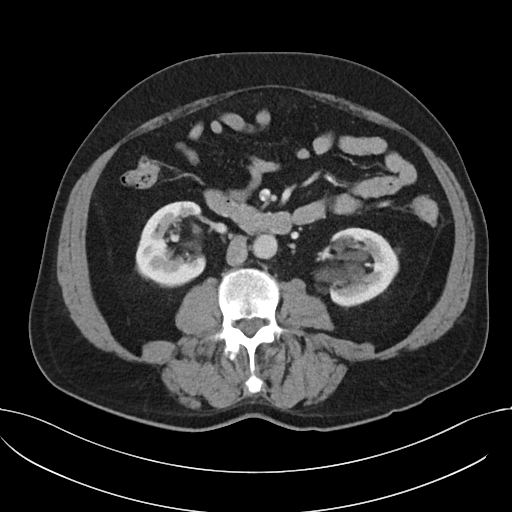
[im 57/91  soft-tissue]
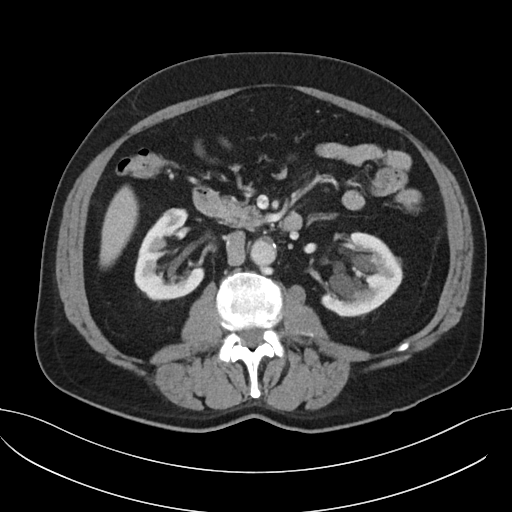
[im 57/91  bone]
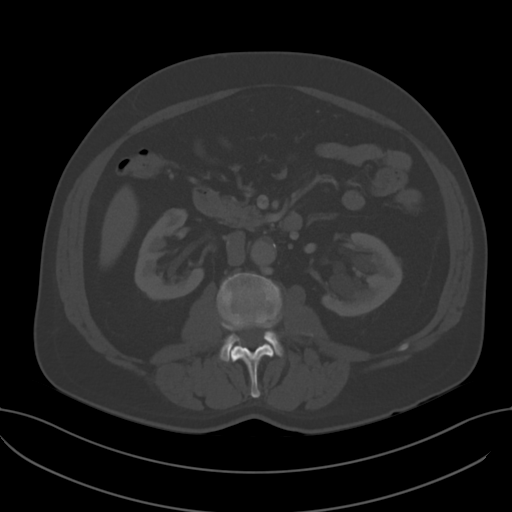
[im 67/91  soft-tissue]
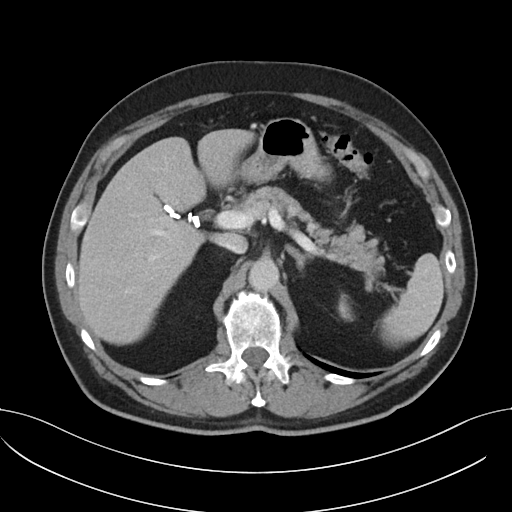
[im 72/91  soft-tissue]
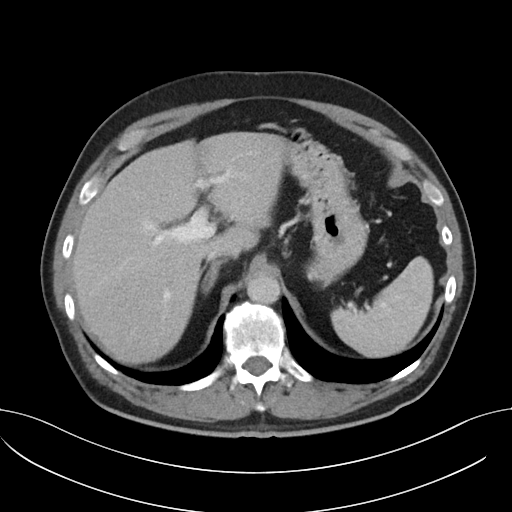
[im 76/91  soft-tissue]
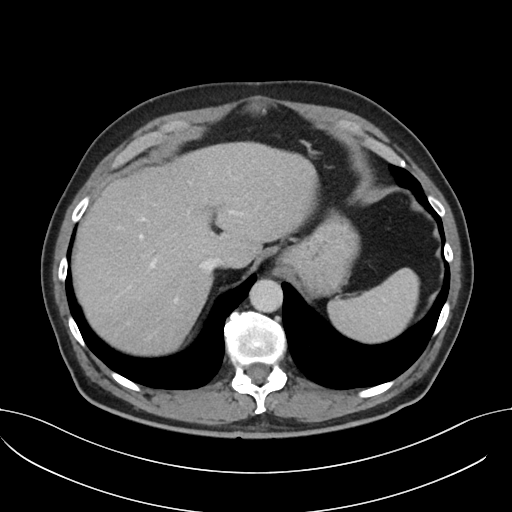
[im 86/91  soft-tissue]
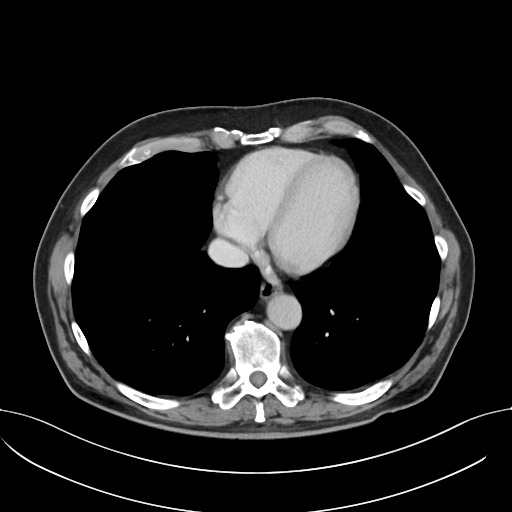

[Series 5: coronal · coronal · 0.83mm/px · 3 of 110 slices shown]
[im 37/110  soft-tissue]
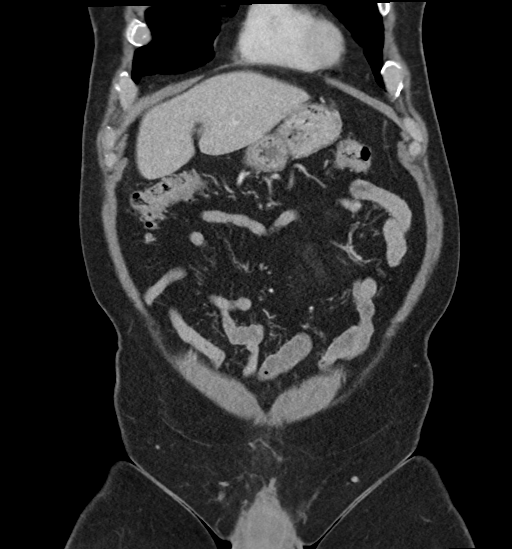
[im 49/110  soft-tissue]
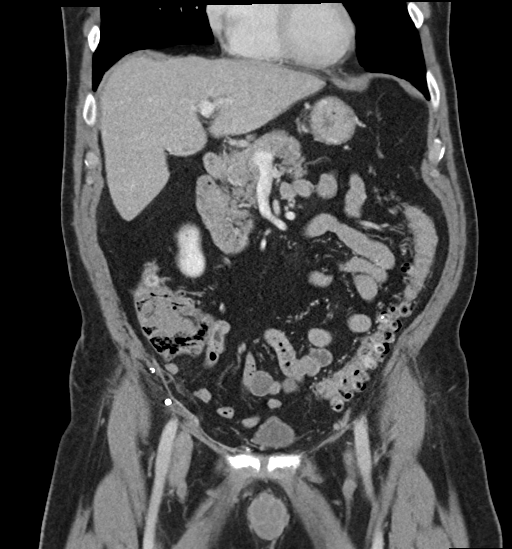
[im 61/110  soft-tissue]
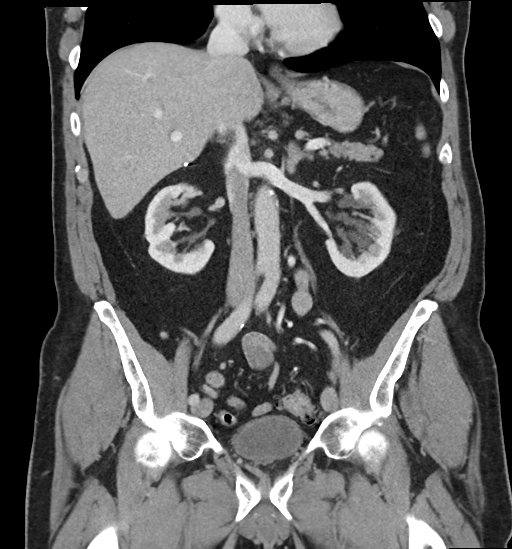

[16 of 46 positions shown; findings below may reference images not displayed]

FINDINGS: Lower chest: Clear lung bases. Normal heart size without pericardial
or pleural effusion.

Hepatobiliary: Normal liver. Cholecystectomy, without biliary ductal
dilatation.

Pancreas: Normal, without mass or ductal dilatation.

Spleen: Normal in size, without focal abnormality.

Adrenals/Urinary Tract: Normal adrenal glands. Left significantly
greater than right renal sinus cysts. Interpolar right renal too
small to characterize lesion. Interpolar left renal 4 mm collecting
system calculus. punctate interpolar right renal collecting system
calculus. No hydroureter or ureteric calculi. Normal urinary
bladder.

Stomach/Bowel: Proximal gastric underdistention. Extensive colonic
diverticulosis. Normal terminal ileum. Normal appendix, including on
62/2. Normal small bowel.

Vascular/Lymphatic: Aortic atherosclerosis. No abdominopelvic
adenopathy.

Reproductive: Mild prostatomegaly.

Other: No significant free fluid. No free intraperitoneal air. Prior
right inguinal hernia repair. Right paramidline small fat containing
ventral abdominal wall hernia on 46/2.

Musculoskeletal: Trace L4-5 anterolisthesis. Degenerate disc disease
at L5-S1.
IMPRESSION: 1. No acute process in the abdomen or pelvis. Normal appendix.
2. Bilateral nephrolithiasis, without obstructive uropathy.
3. Prostatomegaly.
4. Aortic Atherosclerosis ([id number].X).
5. Small fat containing ventral abdominal wall hernia.

## 2023-07-15 ENCOUNTER — Ambulatory Visit
Admission: EM | Admit: 2023-07-15 | Discharge: 2023-07-15 | Disposition: A | Discharge status: Home / Self Care | Payer: Medicare Other

## 2023-07-15 ENCOUNTER — Ambulatory Visit: Payer: Medicare Other

## 2023-07-15 DIAGNOSIS — H905 Unspecified sensorineural hearing loss: Secondary | ICD-10-CM | POA: Insufficient documentation

## 2023-07-15 DIAGNOSIS — Z83719 Family history of colon polyps, unspecified: Secondary | ICD-10-CM | POA: Insufficient documentation

## 2023-07-15 DIAGNOSIS — J300 Vasomotor rhinitis: Secondary | ICD-10-CM | POA: Insufficient documentation

## 2023-07-15 DIAGNOSIS — R7989 Other specified abnormal findings of blood chemistry: Secondary | ICD-10-CM | POA: Insufficient documentation

## 2023-07-15 DIAGNOSIS — Z85820 Personal history of malignant melanoma of skin: Secondary | ICD-10-CM | POA: Insufficient documentation

## 2023-07-15 DIAGNOSIS — E785 Hyperlipidemia, unspecified: Secondary | ICD-10-CM | POA: Insufficient documentation

## 2023-07-15 DIAGNOSIS — H6122 Impacted cerumen, left ear: Secondary | ICD-10-CM

## 2023-07-15 DIAGNOSIS — Z961 Presence of intraocular lens: Secondary | ICD-10-CM | POA: Insufficient documentation

## 2023-07-15 DIAGNOSIS — H401134 Primary open-angle glaucoma, bilateral, indeterminate stage: Secondary | ICD-10-CM | POA: Insufficient documentation

## 2023-07-15 DIAGNOSIS — R7303 Prediabetes: Secondary | ICD-10-CM | POA: Insufficient documentation

## 2023-07-15 DIAGNOSIS — R059 Cough, unspecified: Secondary | ICD-10-CM

## 2023-07-15 DIAGNOSIS — H903 Sensorineural hearing loss, bilateral: Secondary | ICD-10-CM | POA: Insufficient documentation

## 2023-07-15 DIAGNOSIS — Z9049 Acquired absence of other specified parts of digestive tract: Secondary | ICD-10-CM | POA: Insufficient documentation

## 2023-07-15 DIAGNOSIS — H9319 Tinnitus, unspecified ear: Secondary | ICD-10-CM | POA: Insufficient documentation

## 2023-07-15 DIAGNOSIS — Z461 Encounter for fitting and adjustment of hearing aid: Secondary | ICD-10-CM | POA: Insufficient documentation

## 2023-07-15 DIAGNOSIS — E559 Vitamin D deficiency, unspecified: Secondary | ICD-10-CM | POA: Insufficient documentation

## 2023-07-15 DIAGNOSIS — J309 Allergic rhinitis, unspecified: Secondary | ICD-10-CM | POA: Insufficient documentation

## 2023-07-15 DIAGNOSIS — E669 Obesity, unspecified: Secondary | ICD-10-CM | POA: Insufficient documentation

## 2023-07-15 DIAGNOSIS — Z7189 Other specified counseling: Secondary | ICD-10-CM | POA: Insufficient documentation

## 2023-07-15 DIAGNOSIS — K219 Gastro-esophageal reflux disease without esophagitis: Secondary | ICD-10-CM | POA: Insufficient documentation

## 2023-07-15 NOTE — ED Triage Notes (Signed)
"  About a month ago I had the RSV shot, about a week later starting feeling bad, followed by a Cough which still remains at times". "Now my ears are full/stopped up". No fever. No "runny" nose.

## 2023-07-15 NOTE — ED Provider Notes (Signed)
[facility]    CSN: [id number] Arrival date & time: 07/15/23  1105      History   Chief Complaint Chief Complaint  Patient presents with   Ear Problem    "Fullness"    Cough    HPI Brand Alemany. is a 77 y.o. male.   Patient here today for evaluation of cough that he has had intermittently over the last month but states he has also had a sensation of ear fullness to his left ear which is the most concerning symptom today.  He has not had any fever runny nose.  He does note that cough seems significantly better today.  The history is provided by the patient.  Cough Associated symptoms: no chills, no ear pain, no eye discharge, no fever, no shortness of breath and no sore throat     Past Medical History:  Diagnosis Date   GERD (gastroesophageal reflux disease)    History of kidney stones    Hyperlipidemia    Kidney filling defect RIGHT    Patient Active Problem List   Diagnosis Date Noted   Family history of colonic polyps 07/15/2023   GERD (gastroesophageal reflux disease) 07/15/2023   Hyperlipidemia 07/15/2023   Allergic rhinitis, unspecified 07/15/2023   Cough 07/15/2023   Encounter for fitting and adjustment of hearing aid 07/15/2023   History of cholecystectomy 07/15/2023   History of malignant melanoma of skin 07/15/2023   Obesity 07/15/2023   Other specified abnormal findings of blood chemistry 07/15/2023   Other specified counseling 07/15/2023   Prediabetes 07/15/2023   Presence of intraocular lens 07/15/2023   Primary open-angle glaucoma, bilateral, indeterminate stage 07/15/2023   Sensorineural hearing loss, bilateral 07/15/2023   Sensorineural hearing loss 07/15/2023   Tinnitus 07/15/2023   Vasomotor rhinitis 07/15/2023   Vitamin D deficiency 07/15/2023   Lumbar radiculopathy 03/08/2021   Low back pain 08/22/2019   Lumbosacral spondylosis without myelopathy 08/22/2019   Pain in right knee 07/01/2019   History of colonoscopy  11/10/2005    Past Surgical History:  Procedure Laterality Date   CYSTOSCOPY WITH RETROGRADE PYELOGRAM, URETEROSCOPY AND STENT PLACEMENT  10/21/2012   Procedure: CYSTOSCOPY WITH RETROGRADE PYELOGRAM, URETEROSCOPY AND STENT PLACEMENT;  Surgeon: Anner Crete, MD;  Location: [facility];  Service: Urology;  Laterality: Right;  no ureteroscopy/  bladder stone removal   CYSTOSCOPY WITH RETROGRADE PYELOGRAM, URETEROSCOPY AND STENT PLACEMENT  10/26/2012   Procedure: CYSTOSCOPY WITH RETROGRADE PYELOGRAM, URETEROSCOPY AND STENT PLACEMENT;  Surgeon: Anner Crete, MD;  Location: [facility];  Service: Urology;  Laterality: Right;   HEMORRHOID SURGERY     HEMORRHOID SURGERY  04/14/2003   high cholesterol     LAPAROSCOPIC CHOLECYSTECTOMY  12/26/2003   AND LAPAROSCOPIC RIGHT INGUINAL HERNIA REPAIR W/ MESH       Home Medications    Prior to Admission medications   Medication Sig Start Date End Date Taking? Authorizing Provider  Alum Hydroxide-Mag Carbonate 160-105 MG CHEW Chew by mouth. 05/20/23  Yes [provider]  aspirin EC 81 MG tablet Take 81 mg by mouth every morning.   Yes [provider]  Azelastine HCl 137 MCG/SPRAY SOLN Place into both nostrils. 01/29/23  Yes [provider]  azithromycin (ZITHROMAX) 250 MG tablet Take 250 mg by mouth as directed. 06/13/23  Yes [provider]  brimonidine (ALPHAGAN) 0.2 % ophthalmic solution Place 1 drop into both eyes as directed.   Yes [provider]  ciprofloxacin (CIPRO) 500  MG tablet Take 500 mg by mouth 2 (two) times daily. 01/30/23  Yes [provider]  cyanocobalamin (VITAMIN B12) 500 MCG tablet Take 500 mcg by mouth daily. 05/20/23  Yes [provider]  latanoprost (XALATAN) 0.005 % ophthalmic solution Place 1 drop into both eyes at bedtime.   Yes [provider]  levocetirizine (XYZAL) 5 MG tablet Take 5 mg by mouth every evening. 01/29/23  Yes  [provider]  omeprazole (PRILOSEC) 20 MG capsule Take 20 mg by mouth every morning.   Yes [provider]  pravastatin (PRAVACHOL) 20 MG tablet Take 20 mg by mouth at bedtime.   Yes [provider]  cetirizine (ZYRTEC) 10 MG tablet Take 10 mg by mouth daily.    [provider]  cholecalciferol (VITAMIN D) 1000 UNITS tablet Take 2,000 Units by mouth every morning.    [provider]  cyclobenzaprine (FLEXERIL) 10 MG tablet Take 1 tablet (10 mg total) by mouth 2 (two) times daily as needed for muscle spasms. 08/21/21   Alvira Monday, MD  diphenhydrAMINE (BENADRYL) 25 mg capsule Take 25 mg by mouth as needed.    [provider]  dorzolamide (TRUSOPT) 2 % ophthalmic solution Place 1 drop into both eyes 2 (two) times daily.    [provider]  fluticasone (FLONASE) 50 MCG/ACT nasal spray Place 1 spray into both nostrils 2 (two) times daily. 03/21/19   [provider]  Fluticasone Furoate (FLONASE SENSIMIST NA) fluticasone furoate    [provider]  imiquimod (ALDARA) 5 % cream Apply 1 Application topically as directed.    [provider]  ipratropium (ATROVENT) 0.06 % nasal spray Place 1 spray into the nose daily.    [provider]    Family History History reviewed. No pertinent family history.  Social History Social History   Tobacco Use   Smoking status: Former    Types: Cigarettes   Smokeless tobacco: Never  Vaping Use   Vaping status: Never Used  Substance Use Topics   Alcohol use: Not Currently   Drug use: Never     Allergies   Patient has no known allergies.   Review of Systems Review of Systems  Constitutional:  Negative for chills and fever.  HENT:  Positive for hearing loss. Negative for congestion, ear pain and sore throat.   Eyes:  Negative for discharge and redness.  Respiratory:  Positive for cough. Negative for shortness of breath.   Gastrointestinal:   Negative for abdominal pain, nausea and vomiting.     Physical Exam Triage Vital Signs ED Triage Vitals  Encounter Vitals Group     BP 07/15/23 1135 117/67     Systolic BP Percentile --      Diastolic BP Percentile --      Pulse Rate 07/15/23 1135 62     Resp 07/15/23 1135 18     Temp 07/15/23 1135 98 F (36.7 C)     Temp Source 07/15/23 1135 Oral     SpO2 07/15/23 1135 96 %     Weight 07/15/23 1134 205 lb (93 kg)     Height 07/15/23 1134 5\' 11"  (1.803 m)     Head Circumference --      Peak Flow --      Pain Score 07/15/23 1134 0     Pain Loc --      Pain Education --      Exclude from Growth Chart --    No data found.  Updated Vital Signs BP 117/67 (BP Location: Left Arm)   Pulse 62   Temp 98 F (36.7 C) (Oral)   Resp 18   Ht 5\' 11"  (1.803 m)   Wt 205 lb (93 kg)   SpO2 96%   BMI 28.59 kg/m    Physical Exam Vitals and nursing note reviewed.  Constitutional:      General: He is not in acute distress.    Appearance: Normal appearance. He is not ill-appearing.  HENT:     Head: Normocephalic and atraumatic.     Left Ear: Tympanic membrane normal.     Ears:     Comments: Left EAC initially with cerumen impaction, resolved after irrigation    Nose: Nose normal. No congestion.  Eyes:     Conjunctiva/sclera: Conjunctivae normal.  Cardiovascular:     Rate and Rhythm: Normal rate and regular rhythm.     Heart sounds: Normal heart sounds. No murmur heard. Pulmonary:     Effort: Pulmonary effort is normal. No respiratory distress.     Breath sounds: Normal breath sounds. No wheezing, rhonchi or rales.  Skin:    General: Skin is warm and dry.  Neurological:     Mental Status: He is alert.  Psychiatric:        Mood and Affect: Mood normal.        Thought Content: Thought content normal.      UC Treatments / Results  Labs (all labs ordered are listed, but only abnormal results are displayed) Labs Reviewed - No data to display  EKG   Radiology DG Chest  2 View  Result Date: 07/15/2023 CLINICAL DATA:  Cough EXAM: CHEST - 2 VIEW COMPARISON:  02/07/2004 FINDINGS: Lungs are clear. Heart size and mediastinal contours are within normal limits. No effusion. Vertebral endplate spurring at multiple levels in the mid thoracic spine. IMPRESSION: No acute cardiopulmonary disease. Electronically Signed   By: Corlis Leak M.D.   On: 07/15/2023 13:15    Procedures Procedures (including critical care time)  Medications Ordered in UC Medications - No data to display  Initial Impression / Assessment and Plan / UC Course  I have reviewed the triage vital signs and the nursing notes.  Pertinent labs & imaging results that were available during my care of the patient were reviewed by me and considered in my medical decision making (see chart for details).    Chest x-ray clear.  Recommended continued monitoring of cough and follow-up if symptoms do not continue to improve with same.  Cerumen removed from left ear with improvement of symptoms immediately.  Encouraged follow-up with any further concerns.  Final Clinical Impressions(s) / UC Diagnoses   Final diagnoses:  Cough, unspecified type  Impacted cerumen of left ear   Discharge Instructions   None    ED Prescriptions   None    PDMP not reviewed this encounter.   Tomi Bamberger, PA-C 07/15/23 1430

## 2023-08-05 DIAGNOSIS — L812 Freckles: Secondary | ICD-10-CM | POA: Diagnosis not present

## 2023-08-05 DIAGNOSIS — D485 Neoplasm of uncertain behavior of skin: Secondary | ICD-10-CM | POA: Diagnosis not present

## 2023-08-05 DIAGNOSIS — L570 Actinic keratosis: Secondary | ICD-10-CM | POA: Diagnosis not present

## 2023-08-05 DIAGNOSIS — D1801 Hemangioma of skin and subcutaneous tissue: Secondary | ICD-10-CM | POA: Diagnosis not present

## 2023-08-05 DIAGNOSIS — Z85828 Personal history of other malignant neoplasm of skin: Secondary | ICD-10-CM | POA: Diagnosis not present

## 2023-08-05 DIAGNOSIS — L821 Other seborrheic keratosis: Secondary | ICD-10-CM | POA: Diagnosis not present

## 2023-08-05 DIAGNOSIS — L308 Other specified dermatitis: Secondary | ICD-10-CM | POA: Diagnosis not present

## 2023-08-05 DIAGNOSIS — D2262 Melanocytic nevi of left upper limb, including shoulder: Secondary | ICD-10-CM | POA: Diagnosis not present

## 2023-08-19 DIAGNOSIS — R7303 Prediabetes: Secondary | ICD-10-CM | POA: Diagnosis not present

## 2023-08-19 DIAGNOSIS — E538 Deficiency of other specified B group vitamins: Secondary | ICD-10-CM | POA: Diagnosis not present

## 2023-08-19 DIAGNOSIS — Z0000 Encounter for general adult medical examination without abnormal findings: Secondary | ICD-10-CM | POA: Diagnosis not present

## 2023-08-19 DIAGNOSIS — Z125 Encounter for screening for malignant neoplasm of prostate: Secondary | ICD-10-CM | POA: Diagnosis not present

## 2023-08-19 DIAGNOSIS — E559 Vitamin D deficiency, unspecified: Secondary | ICD-10-CM | POA: Diagnosis not present

## 2023-08-26 DIAGNOSIS — Z0000 Encounter for general adult medical examination without abnormal findings: Secondary | ICD-10-CM | POA: Diagnosis not present

## 2023-08-26 DIAGNOSIS — Z23 Encounter for immunization: Secondary | ICD-10-CM | POA: Diagnosis not present

## 2023-08-26 DIAGNOSIS — K219 Gastro-esophageal reflux disease without esophagitis: Secondary | ICD-10-CM | POA: Diagnosis not present

## 2023-08-26 DIAGNOSIS — H938X2 Other specified disorders of left ear: Secondary | ICD-10-CM | POA: Diagnosis not present

## 2023-08-26 DIAGNOSIS — I2510 Atherosclerotic heart disease of native coronary artery without angina pectoris: Secondary | ICD-10-CM | POA: Diagnosis not present

## 2023-08-26 DIAGNOSIS — R7303 Prediabetes: Secondary | ICD-10-CM | POA: Diagnosis not present

## 2023-08-26 DIAGNOSIS — E7800 Pure hypercholesterolemia, unspecified: Secondary | ICD-10-CM | POA: Diagnosis not present

## 2023-08-26 DIAGNOSIS — E559 Vitamin D deficiency, unspecified: Secondary | ICD-10-CM | POA: Diagnosis not present

## 2023-09-09 DIAGNOSIS — H04123 Dry eye syndrome of bilateral lacrimal glands: Secondary | ICD-10-CM | POA: Diagnosis not present

## 2023-09-09 DIAGNOSIS — H35033 Hypertensive retinopathy, bilateral: Secondary | ICD-10-CM | POA: Diagnosis not present

## 2023-09-09 DIAGNOSIS — H401121 Primary open-angle glaucoma, left eye, mild stage: Secondary | ICD-10-CM | POA: Diagnosis not present

## 2023-09-09 DIAGNOSIS — H401113 Primary open-angle glaucoma, right eye, severe stage: Secondary | ICD-10-CM | POA: Diagnosis not present

## 2023-09-24 DIAGNOSIS — H9313 Tinnitus, bilateral: Secondary | ICD-10-CM | POA: Diagnosis not present

## 2023-09-24 DIAGNOSIS — H903 Sensorineural hearing loss, bilateral: Secondary | ICD-10-CM | POA: Diagnosis not present

## 2023-09-24 DIAGNOSIS — H6123 Impacted cerumen, bilateral: Secondary | ICD-10-CM | POA: Diagnosis not present

## 2024-02-02 DIAGNOSIS — L850 Acquired ichthyosis: Secondary | ICD-10-CM | POA: Diagnosis not present

## 2024-02-02 DIAGNOSIS — L821 Other seborrheic keratosis: Secondary | ICD-10-CM | POA: Diagnosis not present

## 2024-02-02 DIAGNOSIS — L820 Inflamed seborrheic keratosis: Secondary | ICD-10-CM | POA: Diagnosis not present

## 2024-02-02 DIAGNOSIS — L570 Actinic keratosis: Secondary | ICD-10-CM | POA: Diagnosis not present

## 2024-02-02 DIAGNOSIS — Z85828 Personal history of other malignant neoplasm of skin: Secondary | ICD-10-CM | POA: Diagnosis not present

## 2024-02-19 DIAGNOSIS — R7303 Prediabetes: Secondary | ICD-10-CM | POA: Diagnosis not present

## 2024-02-19 DIAGNOSIS — E7800 Pure hypercholesterolemia, unspecified: Secondary | ICD-10-CM | POA: Diagnosis not present

## 2024-02-22 DIAGNOSIS — E7800 Pure hypercholesterolemia, unspecified: Secondary | ICD-10-CM | POA: Diagnosis not present

## 2024-02-22 DIAGNOSIS — K219 Gastro-esophageal reflux disease without esophagitis: Secondary | ICD-10-CM | POA: Diagnosis not present

## 2024-02-22 DIAGNOSIS — R7303 Prediabetes: Secondary | ICD-10-CM | POA: Diagnosis not present

## 2024-02-22 DIAGNOSIS — I2510 Atherosclerotic heart disease of native coronary artery without angina pectoris: Secondary | ICD-10-CM | POA: Diagnosis not present

## 2024-02-22 DIAGNOSIS — E559 Vitamin D deficiency, unspecified: Secondary | ICD-10-CM | POA: Diagnosis not present

## 2024-03-24 DIAGNOSIS — N200 Calculus of kidney: Secondary | ICD-10-CM | POA: Diagnosis not present

## 2024-03-24 DIAGNOSIS — R351 Nocturia: Secondary | ICD-10-CM | POA: Diagnosis not present

## 2024-06-08 DIAGNOSIS — R001 Bradycardia, unspecified: Secondary | ICD-10-CM | POA: Diagnosis not present

## 2024-06-27 DIAGNOSIS — H26492 Other secondary cataract, left eye: Secondary | ICD-10-CM | POA: Diagnosis not present

## 2024-06-27 DIAGNOSIS — D3132 Benign neoplasm of left choroid: Secondary | ICD-10-CM | POA: Diagnosis not present

## 2024-06-27 DIAGNOSIS — H401112 Primary open-angle glaucoma, right eye, moderate stage: Secondary | ICD-10-CM | POA: Diagnosis not present

## 2024-06-27 DIAGNOSIS — Z961 Presence of intraocular lens: Secondary | ICD-10-CM | POA: Diagnosis not present

## 2024-06-27 DIAGNOSIS — H401121 Primary open-angle glaucoma, left eye, mild stage: Secondary | ICD-10-CM | POA: Diagnosis not present

## 2024-08-04 DIAGNOSIS — L570 Actinic keratosis: Secondary | ICD-10-CM | POA: Diagnosis not present

## 2024-08-04 DIAGNOSIS — D1801 Hemangioma of skin and subcutaneous tissue: Secondary | ICD-10-CM | POA: Diagnosis not present

## 2024-08-04 DIAGNOSIS — L821 Other seborrheic keratosis: Secondary | ICD-10-CM | POA: Diagnosis not present

## 2024-08-04 DIAGNOSIS — D2262 Melanocytic nevi of left upper limb, including shoulder: Secondary | ICD-10-CM | POA: Diagnosis not present

## 2024-08-04 DIAGNOSIS — C44319 Basal cell carcinoma of skin of other parts of face: Secondary | ICD-10-CM | POA: Diagnosis not present

## 2024-08-04 DIAGNOSIS — L812 Freckles: Secondary | ICD-10-CM | POA: Diagnosis not present

## 2024-08-04 DIAGNOSIS — Z85828 Personal history of other malignant neoplasm of skin: Secondary | ICD-10-CM | POA: Diagnosis not present

## 2024-08-04 DIAGNOSIS — L918 Other hypertrophic disorders of the skin: Secondary | ICD-10-CM | POA: Diagnosis not present

## 2024-08-04 DIAGNOSIS — D485 Neoplasm of uncertain behavior of skin: Secondary | ICD-10-CM | POA: Diagnosis not present

## 2024-08-24 DIAGNOSIS — E538 Deficiency of other specified B group vitamins: Secondary | ICD-10-CM | POA: Diagnosis not present

## 2024-08-24 DIAGNOSIS — R7303 Prediabetes: Secondary | ICD-10-CM | POA: Diagnosis not present

## 2024-08-24 DIAGNOSIS — E7800 Pure hypercholesterolemia, unspecified: Secondary | ICD-10-CM | POA: Diagnosis not present

## 2024-08-24 DIAGNOSIS — Z0000 Encounter for general adult medical examination without abnormal findings: Secondary | ICD-10-CM | POA: Diagnosis not present

## 2024-08-24 DIAGNOSIS — E559 Vitamin D deficiency, unspecified: Secondary | ICD-10-CM | POA: Diagnosis not present

## 2024-08-26 DIAGNOSIS — Z23 Encounter for immunization: Secondary | ICD-10-CM | POA: Diagnosis not present

## 2024-08-31 DIAGNOSIS — R7303 Prediabetes: Secondary | ICD-10-CM | POA: Diagnosis not present

## 2024-08-31 DIAGNOSIS — K219 Gastro-esophageal reflux disease without esophagitis: Secondary | ICD-10-CM | POA: Diagnosis not present

## 2024-08-31 DIAGNOSIS — I2510 Atherosclerotic heart disease of native coronary artery without angina pectoris: Secondary | ICD-10-CM | POA: Diagnosis not present

## 2024-08-31 DIAGNOSIS — E559 Vitamin D deficiency, unspecified: Secondary | ICD-10-CM | POA: Diagnosis not present

## 2024-08-31 DIAGNOSIS — E7800 Pure hypercholesterolemia, unspecified: Secondary | ICD-10-CM | POA: Diagnosis not present

## 2024-08-31 DIAGNOSIS — Z0000 Encounter for general adult medical examination without abnormal findings: Secondary | ICD-10-CM | POA: Diagnosis not present

## 2024-09-05 DIAGNOSIS — C44319 Basal cell carcinoma of skin of other parts of face: Secondary | ICD-10-CM | POA: Diagnosis not present

## 2024-09-05 DIAGNOSIS — Z85828 Personal history of other malignant neoplasm of skin: Secondary | ICD-10-CM | POA: Diagnosis not present
